# Patient Record
Sex: Female | Born: 1955 | Race: White | Hispanic: No | Marital: Married | Smoking: Former smoker
Health system: Southern US, Community
[De-identification: ages and names within clinical notes are randomized; demographics above are authoritative.]

## PROBLEM LIST (undated history)

## (undated) DIAGNOSIS — E119 Type 2 diabetes mellitus without complications: Secondary | ICD-10-CM

## (undated) DIAGNOSIS — R112 Nausea with vomiting, unspecified: Secondary | ICD-10-CM

## (undated) DIAGNOSIS — M1990 Unspecified osteoarthritis, unspecified site: Secondary | ICD-10-CM

## (undated) DIAGNOSIS — I2699 Other pulmonary embolism without acute cor pulmonale: Secondary | ICD-10-CM

## (undated) DIAGNOSIS — Z9889 Other specified postprocedural states: Secondary | ICD-10-CM

## (undated) HISTORY — PX: ABDOMINAL HYSTERECTOMY: SHX81

---

## 1998-12-09 HISTORY — PX: CARPAL TUNNEL RELEASE: SHX101

## 2001-07-30 ENCOUNTER — Ambulatory Visit (HOSPITAL_COMMUNITY): Admission: RE | Admit: 2001-07-30 | Discharge: 2001-07-30 | Payer: Self-pay | Admitting: Family Medicine

## 2001-07-30 ENCOUNTER — Encounter: Payer: Self-pay | Admitting: Family Medicine

## 2001-11-23 ENCOUNTER — Encounter: Payer: Self-pay | Admitting: Emergency Medicine

## 2001-11-23 ENCOUNTER — Inpatient Hospital Stay (HOSPITAL_COMMUNITY): Admission: EM | Admit: 2001-11-23 | Discharge: 2001-11-25 | Payer: Self-pay

## 2001-12-21 ENCOUNTER — Encounter: Payer: Self-pay | Admitting: Family Medicine

## 2001-12-21 ENCOUNTER — Ambulatory Visit (HOSPITAL_COMMUNITY): Admission: RE | Admit: 2001-12-21 | Discharge: 2001-12-21 | Payer: Self-pay | Admitting: Family Medicine

## 2003-12-10 DIAGNOSIS — I2699 Other pulmonary embolism without acute cor pulmonale: Secondary | ICD-10-CM

## 2003-12-10 HISTORY — DX: Other pulmonary embolism without acute cor pulmonale: I26.99

## 2004-12-06 ENCOUNTER — Inpatient Hospital Stay (HOSPITAL_COMMUNITY): Admission: EM | Admit: 2004-12-06 | Discharge: 2004-12-08 | Payer: Self-pay | Admitting: Internal Medicine

## 2004-12-19 ENCOUNTER — Ambulatory Visit (HOSPITAL_COMMUNITY): Admission: RE | Admit: 2004-12-19 | Discharge: 2004-12-19 | Payer: Self-pay | Admitting: Family Medicine

## 2004-12-19 ENCOUNTER — Inpatient Hospital Stay (HOSPITAL_COMMUNITY): Admission: AD | Admit: 2004-12-19 | Discharge: 2004-12-22 | Payer: Self-pay | Admitting: Family Medicine

## 2004-12-28 ENCOUNTER — Ambulatory Visit (HOSPITAL_COMMUNITY): Admission: RE | Admit: 2004-12-28 | Discharge: 2004-12-28 | Payer: Self-pay | Admitting: Family Medicine

## 2005-01-16 ENCOUNTER — Inpatient Hospital Stay (HOSPITAL_COMMUNITY): Admission: AD | Admit: 2005-01-16 | Discharge: 2005-01-21 | Payer: Self-pay | Admitting: Pediatrics

## 2005-07-15 ENCOUNTER — Ambulatory Visit (HOSPITAL_COMMUNITY): Admission: RE | Admit: 2005-07-15 | Discharge: 2005-07-15 | Payer: Self-pay | Admitting: Family Medicine

## 2005-09-03 IMAGING — CT CT ANGIO CHEST
2 of 4 series · 19 of 36 positions shown · IV contrast (CONTRAST)
Comparison: none

CLINICAL DATA: Chest pain and dyspnea.
 CHEST CT ANGIO WITH CONTRAST ? 12/19/04:
TECHNIQUE: Multidetector CT imaging of the chest was performed according to the protocol for detection of pulmonary embolism during IV bolus injection of 150 ml Omnipaque 300.  Coronal and sagittal plane reformatted images were also generated.

[Series 6460: — · axial · 0.66mm/px · z∈[+1549,+1803]mm · 16 of 468 slices shown (1 of 2)]
[im 22/468  lung]
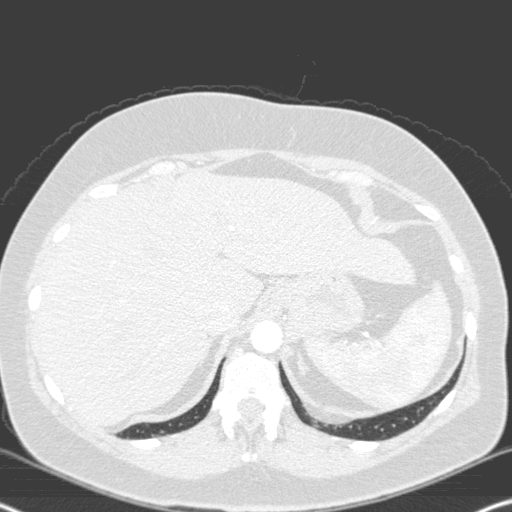
[im 43/468  mediastinal]
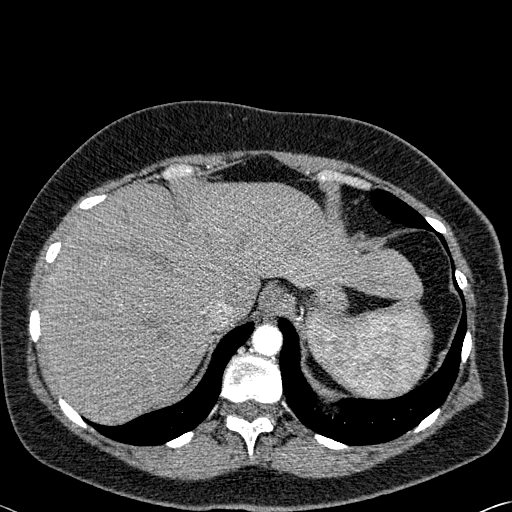
[im 85/468  lung]
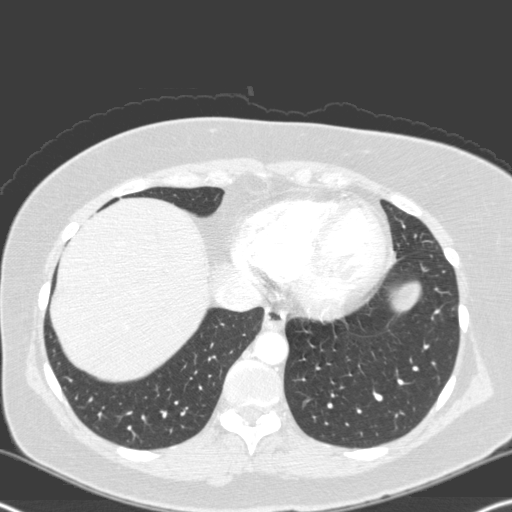
[im 107/468  mediastinal]
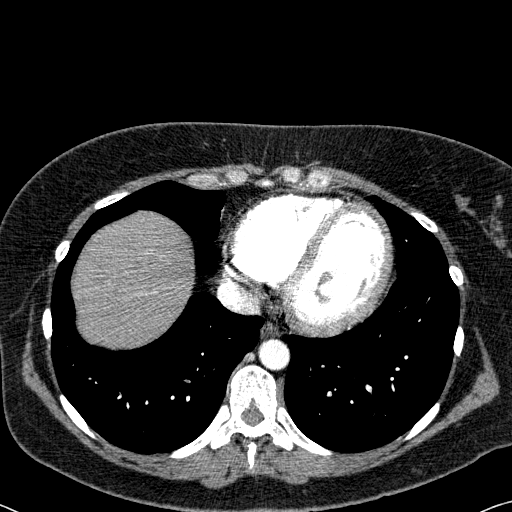
[im 128/468  lung]
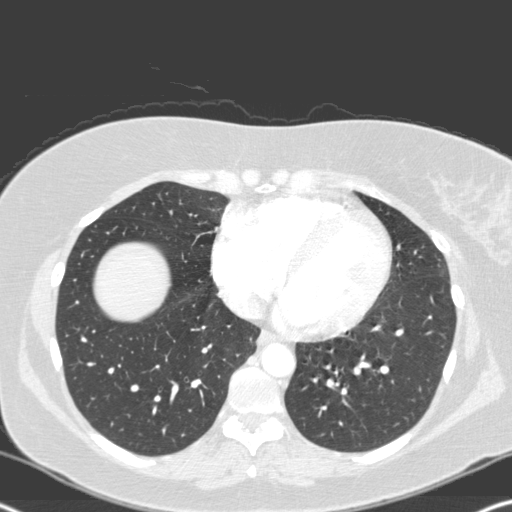
[im 170/468  mediastinal]
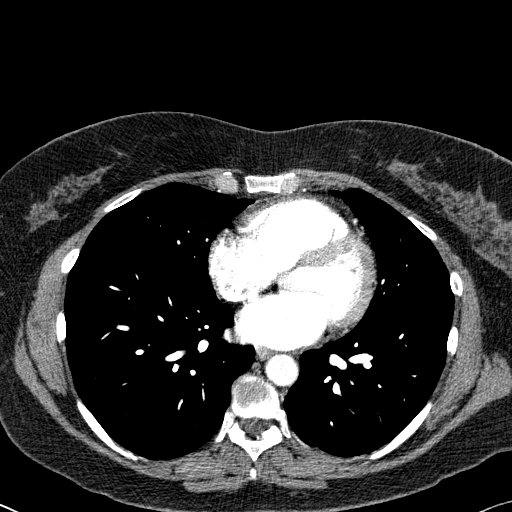
[im 192/468  lung]
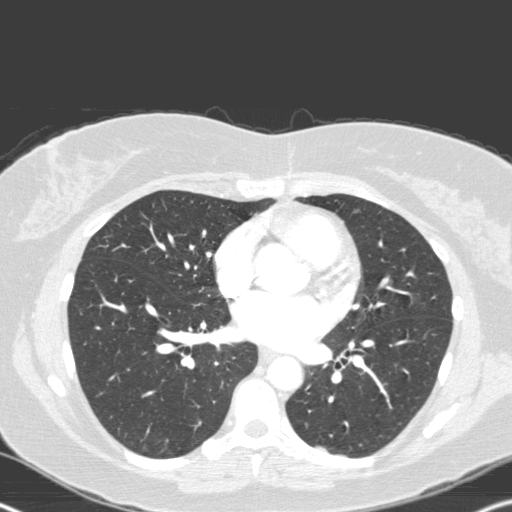
[im 213/468  mediastinal]
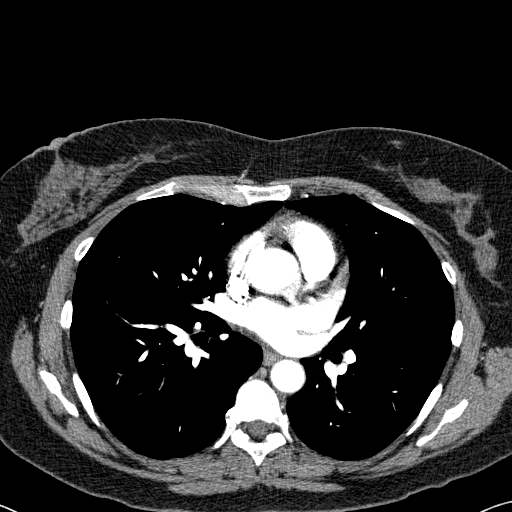
[im 255/468  lung]
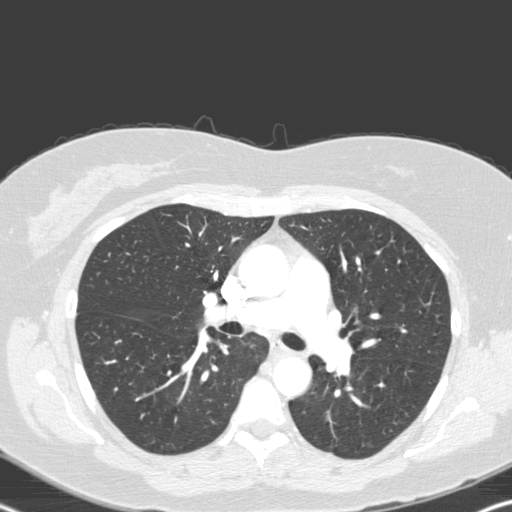
[im 276/468  mediastinal]
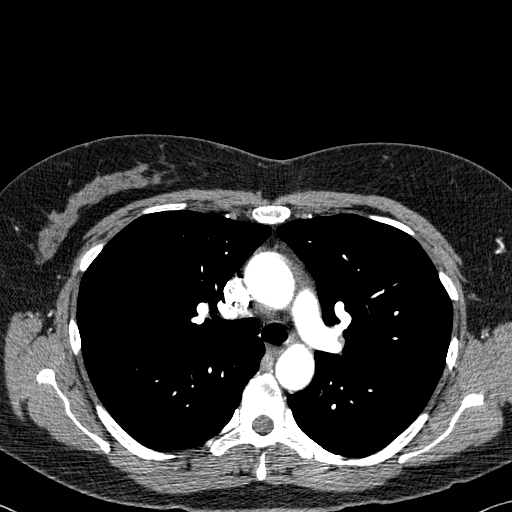
[im 298/468  lung]
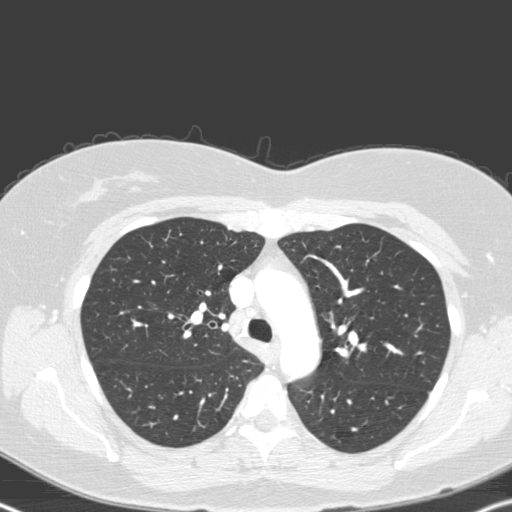
[im 340/468  mediastinal]
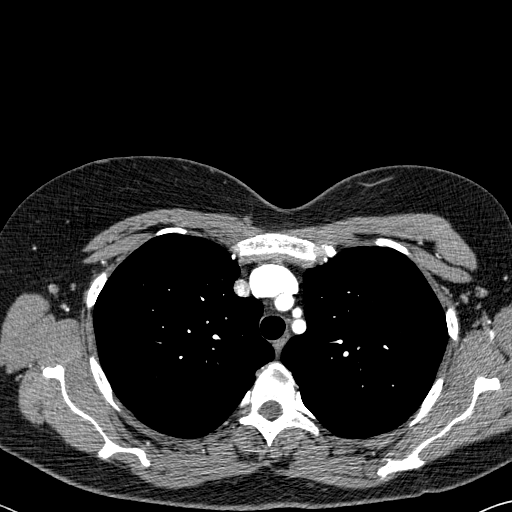
[im 361/468  lung]
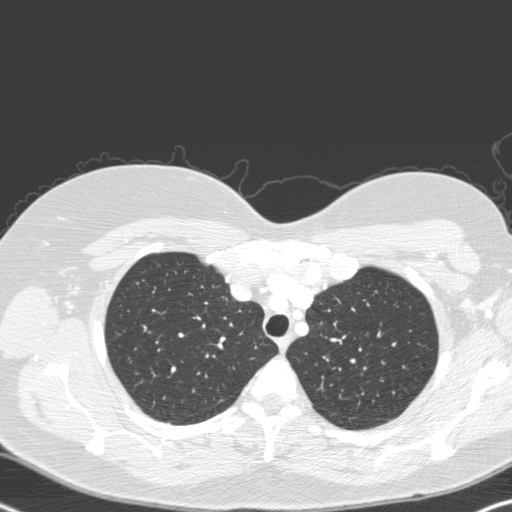
[im 383/468  mediastinal]
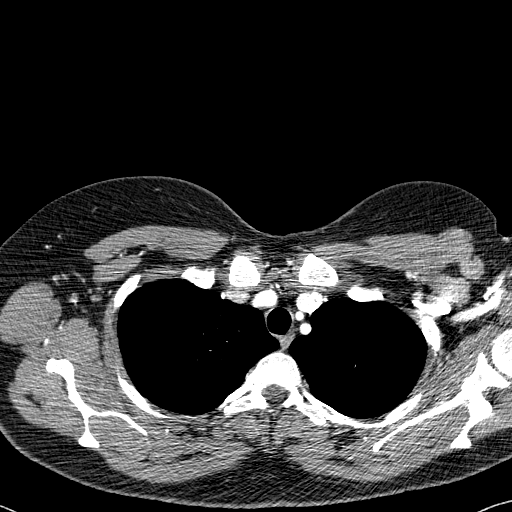
[im 425/468  lung]
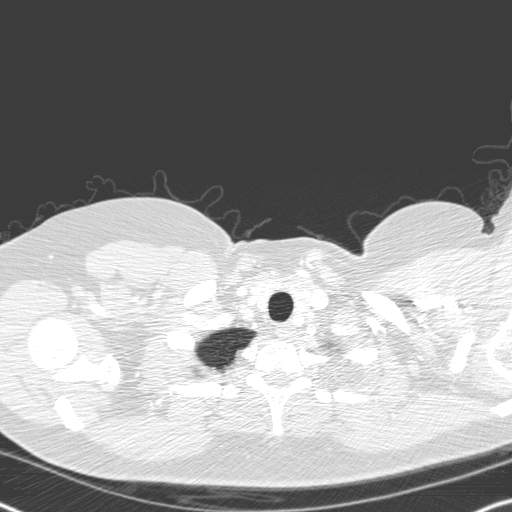
[im 446/468  mediastinal]
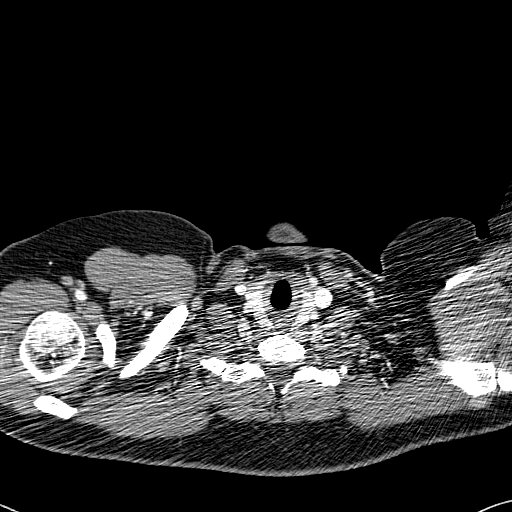

[— · coronal · 0.56mm/px · 3 of 60 slices shown (2 of 2)]
[im 12/60  mediastinal]
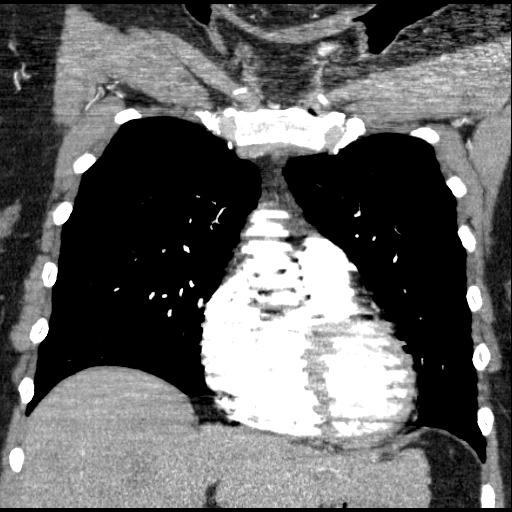
[im 24/60  mediastinal]
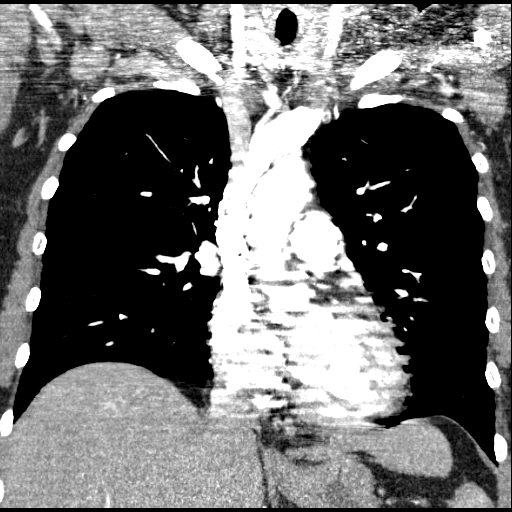
[im 36/60  mediastinal]
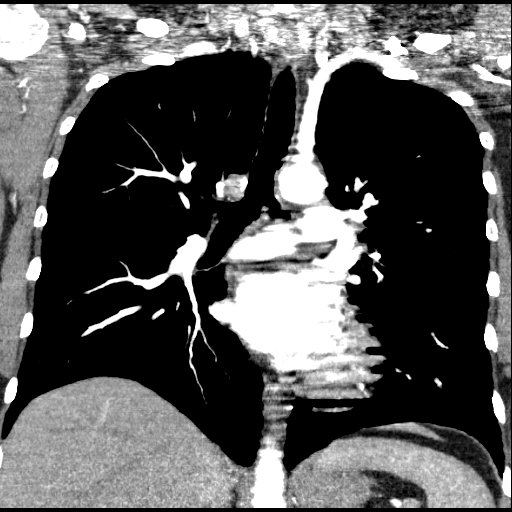

[19 of 36 positions shown; findings below may reference images not displayed]

FINDINGS: There is a single filling defect in a subsegmental branch of the posterior right lower lobe pulmonary artery (images #[id number]).  No other pulmonary emboli are identified.  The remainder of the heart and great vessels are unremarkable.  No pleural or pericardial effusions are identified.  The lungs are otherwise clear.  A tiny posterior left diaphragmatic hernia is noted with a small amount of herniated fat.
IMPRESSION: Single subsegmental pulmonary embolus in the right lower lobe.  Correlate clinically.

## 2009-09-06 ENCOUNTER — Ambulatory Visit (HOSPITAL_COMMUNITY): Admission: RE | Admit: 2009-09-06 | Discharge: 2009-09-06 | Payer: Self-pay | Admitting: Family Medicine

## 2009-09-14 ENCOUNTER — Ambulatory Visit (HOSPITAL_COMMUNITY): Admission: RE | Admit: 2009-09-14 | Discharge: 2009-09-14 | Payer: Self-pay | Admitting: Chiropractic Medicine

## 2009-09-21 ENCOUNTER — Ambulatory Visit (HOSPITAL_COMMUNITY): Admission: RE | Admit: 2009-09-21 | Discharge: 2009-09-21 | Payer: Self-pay | Admitting: Family Medicine

## 2009-09-21 ENCOUNTER — Encounter (INDEPENDENT_AMBULATORY_CARE_PROVIDER_SITE_OTHER): Payer: Self-pay | Admitting: Diagnostic Radiology

## 2010-03-28 ENCOUNTER — Ambulatory Visit (HOSPITAL_COMMUNITY): Admission: RE | Admit: 2010-03-28 | Discharge: 2010-03-28 | Payer: Self-pay | Admitting: Family Medicine

## 2010-06-06 IMAGING — US US BIOPSY
1 series · 6 of 6 positions shown · non-contrast
Comparison: none

Addendum Begins

I discussed the pathology results with the patient by telephone on
09/22/2009.  Pathology demonstrates stromal fibrosis which, per
discussion with Dr. Ammaro, is felt to be concordant with the imaging
appearance.  6-month follow-up left diagnostic mammogram is
recommended for this and the more posterior finding in the left
breast.  The patient reports no problems at the biopsy site.  All
questions were answered.
Addendum Ends
CLINICAL DATA: Suspicious left breast mass [DATE] location

[Series 1: us biopsy · 0.07mm/px · 6 of 6 slices shown]
[im 1/6]
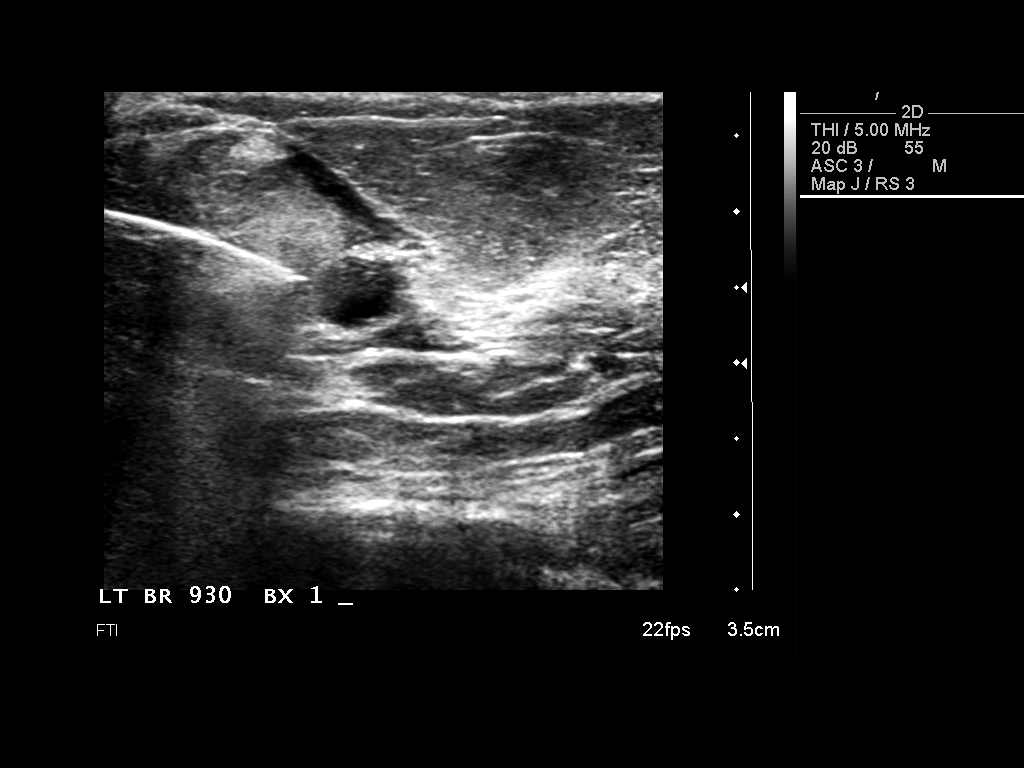
[im 2/6]
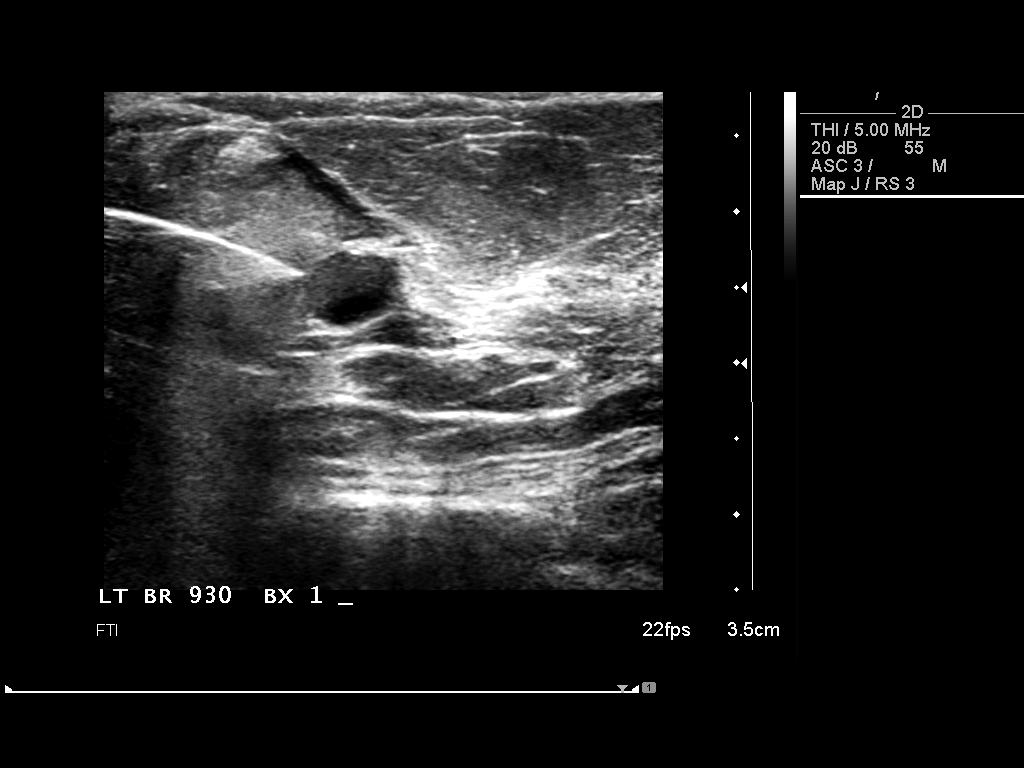
[im 3/6]
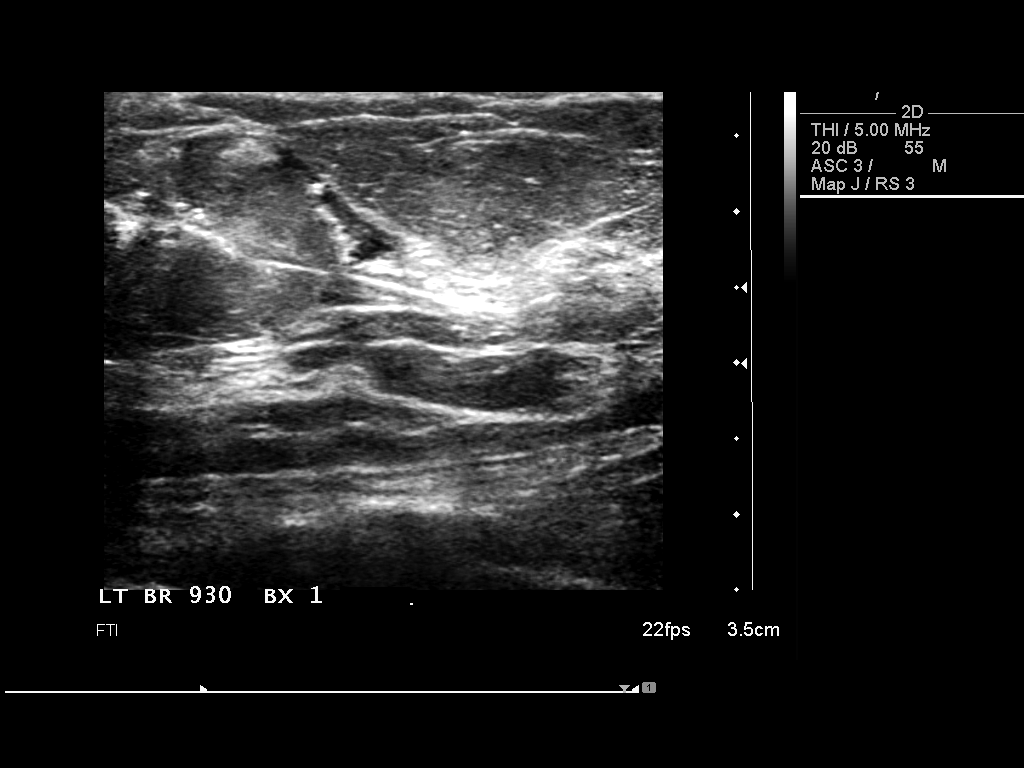
[im 4/6]
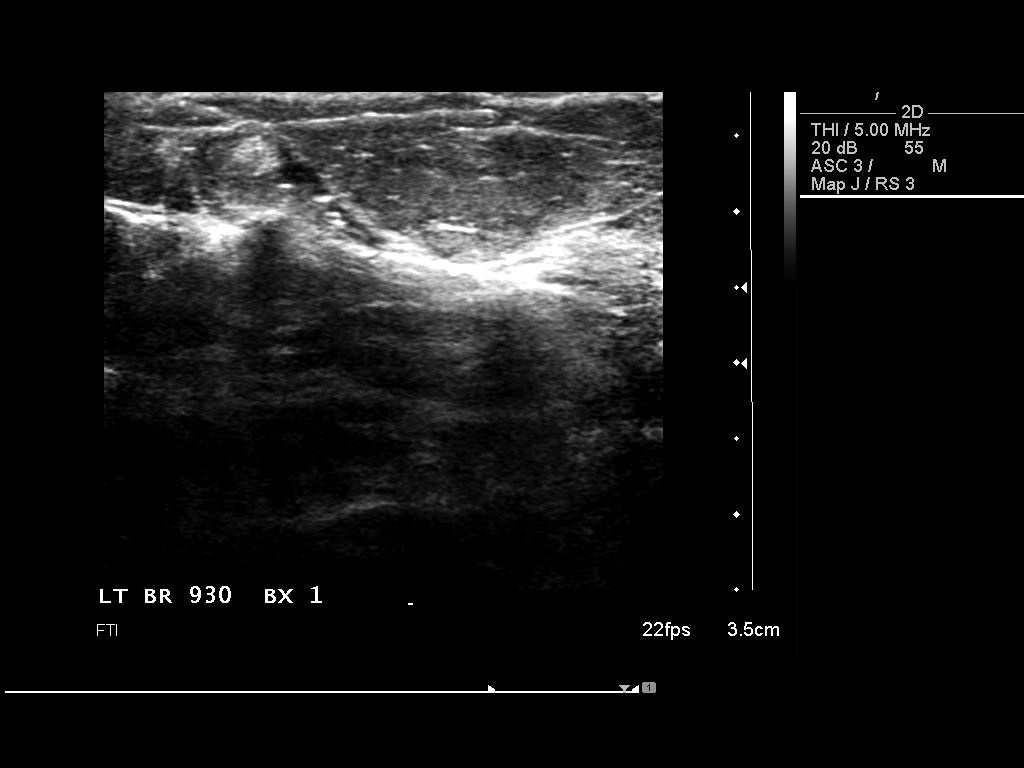
[im 5/6]
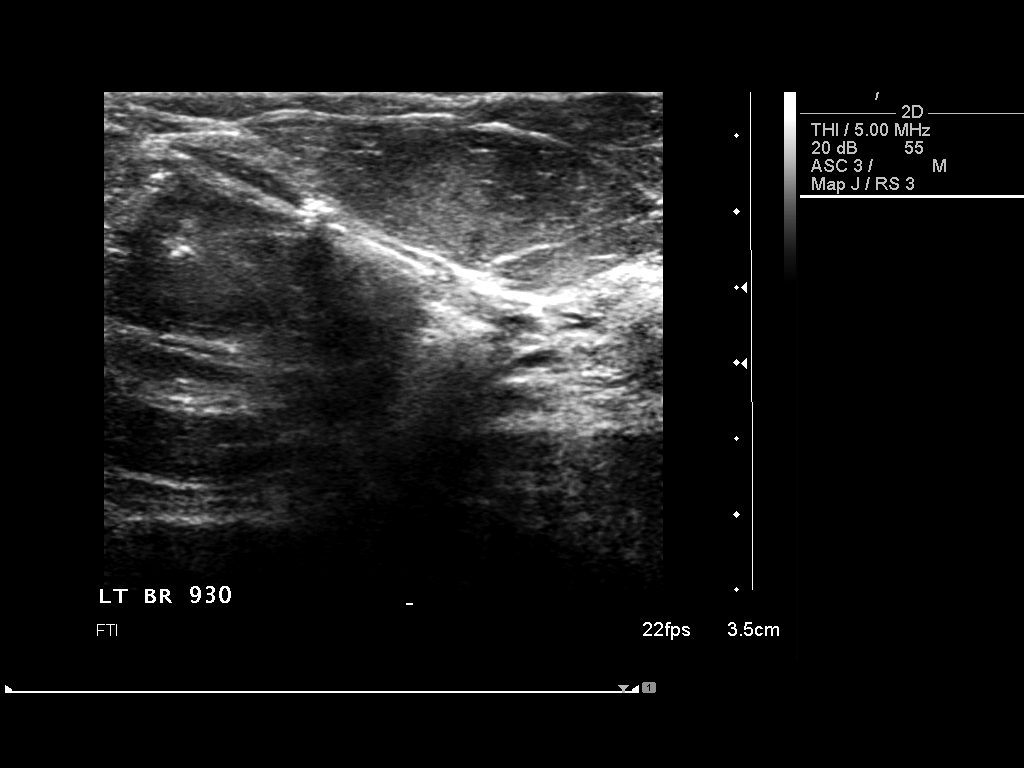
[im 6/6]
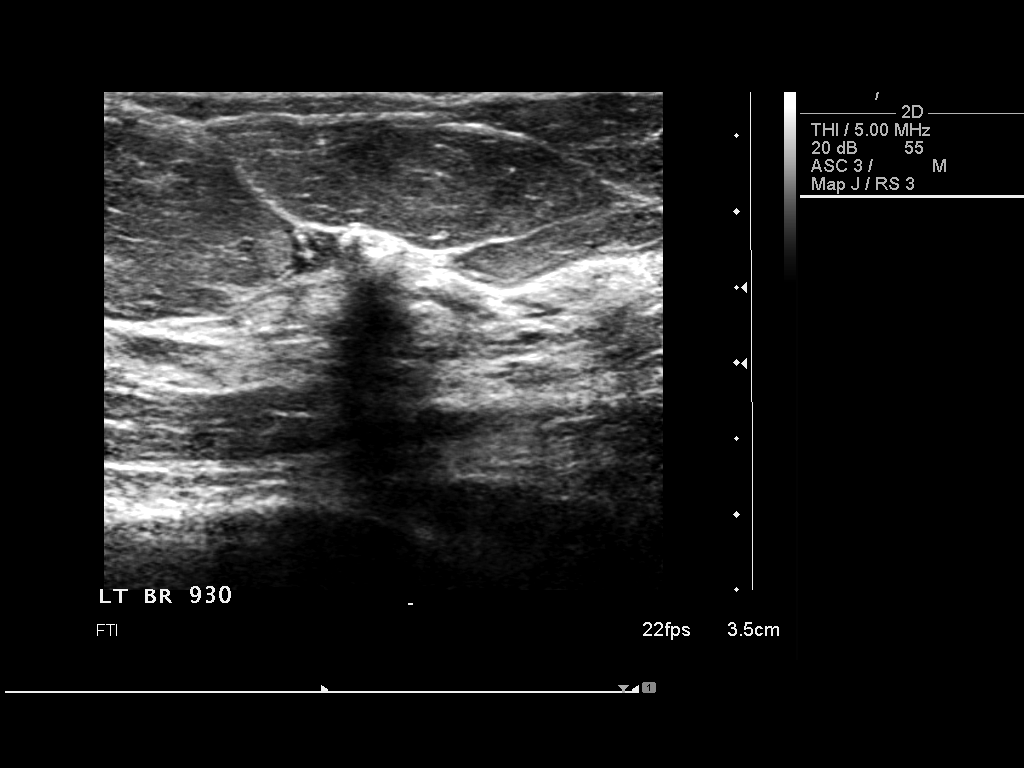

[6 of 6 positions shown; findings below may reference images not displayed]

ULTRASOUND GUIDED VACUUM ASSISTED CORE BIOPSY OF THE LEFT BREAST

I met with the patient, and we discussed the procedure of
ultrasound-guided biopsy, including risks, benefits, and
alternatives.  Specifically, we discussed the risks of infection,
bleeding, tissue injury, clip migration, and inadequate sampling.
Informed, written consent was given.

Using sterile technique, 2% lidocaine, ultrasound guidance, and a
12 gauge vacuum assisted needle, biopsy was performed of the left
breast mass [DATE] location, using a medial to lateral approach.
After 1 pass, the mass was no longer visible.  At the conclusion of
the procedure, a tissue marker clip was deployed into the biopsy
cavity.  Follow-up 2-view mammogram was performed and dictated
separately.
IMPRESSION: Ultrasound-guided biopsy of left breast mass [DATE] location, with
clip placement.  No apparent complications.

## 2010-10-24 ENCOUNTER — Ambulatory Visit (HOSPITAL_COMMUNITY): Admission: RE | Admit: 2010-10-24 | Discharge: 2010-10-24 | Payer: Self-pay | Admitting: Family Medicine

## 2011-04-26 NOTE — Discharge Summary (Signed)
[facility]  Patient:    [name], WOJNAR Visit Number: [id number] MRN: [id number]          Service Type: MED Location: 3A A308 01 Attending Physician:  John Giovanni Dictated by:   John Giovanni, [name] Proc. Date: 11/25/01 Admit Date:  11/22/2001 Discharge Date: 11/25/2001                             Discharge Summary  BRIEF HISTORY:  This 55 year old woman was admitted to the hospital with presumptive viral gastroenteritis.  She had a benign 3 day hospital course extending from December 16, to November 25, 2001.  Vital signs remained stable.  Her admission white cell count was 15,200, with 89 neutrophils, 3 lymphs. Met-7 was normal except for a glucose of 151.  Her repeat white cell count on the day of discharge was 8,000.  She had at this time 73% neutrophils and 18% lymphocytes.  Repeat Met-7 showed potassium 3.0, glucose 114, BUN 2 with a creatinine of 0.6.  Her abdominal and chest x-rays were essentially normal.  After being in the hospital for several days she noted that she had been having dysuria for 2 days before hospitalization.  Treatment included IV normal saline 125 cc/hr with a clear liquid diet and IV Phenergan and Demerol.  She was given Rocephin 1 gram IV q.24h with p.r.n. Tylenol and Ambien.  By her second day IV was able to be decreased to 75 mL/hr.  She was put back on lorcet 7.5 q.i.d. which she had been on chronically for fibromyalgia and rheumatoid arthritis.  She was tolerating liquids well at that point and by the day of discharge was tolerating solids well.  Still with diarrhea but the nausea and vomiting having cleared.  DISCHARGE DIAGNOSES: 1. Probable viral gastroenteritis. 2. Possible UTI. 3. Hyperglycemia both on a random specimen (151), and a fasting specimen    (114). 4. Mild dehydration. 5. Rheumatoid arthritis.  PLAN: 1. Discharge her home on Ceftin 500 mg b.i.d. x 7 days (#14 no refills). 2.  Continue with methotrexate 2.5 mg 4 q. week. 3. Lorcet 7.5 q.i.d.  FOLLOW UP:  Follow up in the office as scheduled December 29, 2000. Dictated by:   John Giovanni, [name] Attending Physician:  John Giovanni DD:  11/25/01 TD:  11/25/01 Job: [id number]

## 2011-04-26 NOTE — Group Therapy Note (Signed)
[facility]  Patient:    [name], [name] Visit Number: [id number] MRN: [id number]          Service Type: MED Location: 3A A308 01 Attending Physician:  Herbert Seta Dictated by:   John Giovanni, [name] Admit Date:  11/22/2001                               Progress Note  SUBJECTIVE:  The patient is feeling much better.  She is having diarrhea today but the nausea and vomiting have cleared.  She notes in the last month she has had intermittent fevers.  She was treated with antibiotics recently and within a few days, started with fevers again and also has noted she has been having burning with urination for at least a week. This burning has now cleared in the hospital.  OBJECTIVE:  Temperature is 98.8, pulse 77, the respiratory rate is 20, blood pressure 112/67.  She is oriented and alert, in no acute distress, well-developed, well-nourished.  Color is good tonight.  Looks better hydrated.  The lungs are clear throughout.  The heart had a regular rhythm, rate of about 70.  The abdomen is soft without hepatosplenomegaly or mass. Skin turgor is normal.  Mucous membranes are moist.  ASSESSMENT: 1. Probably viral gastroenteritis. 2. Cannot rule out bacterial cystitis or even a pyelonephritis.  PLAN:  Continue the IV Rocephin.  Decrease IV to 75 cc/hr from 125 cc/hr. Start back on Lorcet 7.5 mg q.i.d. and use Demerol just for cover for severe pain.  Plan to discharge home tomorrow on p.o. antibiotics.  Recheck a CBC and a MET-7 in the morning, also A1c, given her admission glucose of 151. Dictated by:   John Giovanni, [name] Attending Physician:  Herbert Seta DD:  11/24/01 TD:  11/25/01 Job: [id number]

## 2011-04-26 NOTE — Discharge Summary (Signed)
NAMEPAIZLIE, Diana                ACCOUNT NO.:  [id number]   MEDICAL RECORD NO.:  [id number]          PATIENT TYPE:  INP   LOCATION:  A338                          FACILITY:  [facility]   PHYSICIAN:  Mila Homer. Sudie Bailey, M.D.DATE OF BIRTH:  1956-10-20   DATE OF ADMISSION:  12/19/2004  DATE OF DISCHARGE:  01/14/2006LH                                 DISCHARGE SUMMARY   HOSPITAL COURSE:  This 55 year old had a four day hospitalization at [facility] staying from December 19, 2004-December 22, 2004.  She was found  to have pulmonary emboli.  Vital signs were stable.   On admission blood gas on room air showed a pH of 7.43, PCO2 of 36, PO2 of  97, bicarbonate 24.  A CBC was normal.  Admission INR was 1.0 and 1.2 on her  second day, 2.1 on her third day, and 2.5 on her fourth day.  Her  sedimentation rate was 9.  B-MET was normal except for a glucose that was  mildly elevated at 115.  Her hepatic function was normal.   She had an admission chest x-ray, which showed no acute cardiopulmonary  disease.  Her CT angiogram with contrast of the chest showed a single  filling defect in the subsegmental branch of the posterior right lower lobe  pulmonary artery.  Her bilateral venous Dopplers were normal with no signs  of deep venous thrombophlebitis.   She was admitted to the hospital and started on Lovenox 1 mg/k  subcutaneously q.12h., O2 at 2 L/minute, Coumadin 10 mg immediately and 10  mg on the night of admission, with Lorcet Plus q.i.d. for pain or Tylox  __________ mg daily, Lexapro 20 mg daily, Lunesta 3 mg q.h.s. for sleep,  folic acid 1 mg daily, and KCl 20 mEq twice a day.  She was initially at bed  rest and given a regular diet.   She had an EKG, which was felt to be unremarkable.   She seemed to be gradually improving during the hospitalization but still  remained fairly short of breath.  She was stable and ready for discharge  home her fourth day.   DISCHARGE MEDICATIONS:  1.  Lorcet Plus q.i.d. (120 with 2 refills).  2.  Prevacid 30 mg daily (#30 with 11 refills).  3.  Coumadin 5 mg alternating 10 mg one day and 5 another, starting with 2      tablets of 5 mg Coumadin the night of discharge Saturday, December 22, 2004 (#45 with 2 refills).  4.  She is to continue KCl 20 mEq 1 daily.  5.  Lexapro 20 mg daily.   FOLLOW UP:  In my office in three days for a PT-INR and also she is to bring  her old medicines at that time.   FINAL DISCHARGE DIAGNOSES:  1.  Pulmonary embolus.  2.  Dyspnea, which may be secondary to pulmonary emboli or other etiologies      such as coronary artery disease, fibrosis, etc.  3.  Depression.  4.  Fibromyalgia.  5.  Chronic narcotic  use due to fibromyalgia.  6.  Hypokalemia.   DISCHARGE INSTRUCTIONS:  After discharge arrangements will be made for her  to have pulmonary function tests within about two weeks of discharge and  also she is to see Dr. Domingo Sep, cardiologist, with the thought that if she  is still having any chest heaviness with exertion that she will need a  cardiac catheterization to supplement her fairly extensive cardiac workup  she had done several years ago.   ADDENDUM:  Time spent today reviewing her paper medical record, electronic  medical record, talking to her, writing out prescriptions, formulating a  plan, and dictating a note was 45 minutes.      SDK/MEDQ  D:  12/22/2004  T:  12/22/2004  Job:  [id number]

## 2011-04-26 NOTE — H&P (Signed)
[name], [name]                ACCOUNT NO.:  [id number]   MEDICAL RECORD NO.:  [id number]          PATIENT TYPE:  INP   LOCATION:  A222                          FACILITY:  [facility]   PHYSICIAN:  Mila Homer. Sudie Bailey, M.D.DATE OF BIRTH:  [birth year]   DATE OF ADMISSION:  12/19/2004  DATE OF DISCHARGE:  LH                                HISTORY & PHYSICAL   This is a 55 year old who presented to the office the day of admission,  December 19, 2004.  She had been recently hospitalized with a syncopal  episode that was felt to be viral gastroenteritis.  She had no further  syncopal episodes but just did not feel energetic.  However, she told me she  had been becoming markedly more short of breath over the last month.  Minimal activity would induce dyspnea.   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   She has had fibromyalgia with work-up here and also in [place].  Currently she is on methotrexate 2.5 mg 7 a week which seems to control the  symptoms somewhat.  She has been trying many other drugs for this in the  past including tricyclic antidepressants.  She also uses hydrocodone/APAP  7.5/650 four times daily  for pain and has been on an NSAID as well.  She  has had no chest pain or chest fluttering.  She has had no nausea, vomiting,  diarrhea at present.   She was seen in the office.  She seemed to be breathing well.  Color was  good at rest.  Her heart had a regular rhythm without murmur, rate of 70.  The lungs appeared clear throughout. There were no intercostal fractures.  There was no use of accessory muscles of respiration.  The abdomen was soft  without hepatosplenomegaly or mass or tenderness.  There was no axillary or  supraclavicular adenopathy.  Negative anterior cervical nodes.  The patient  herself is oriented and alert and although she is fatigued, with no acute  distress.  There is no edema of her  ankles.   Blood gases on admission showed pH 7.43, PCO2 36, PO2 of 97 on room air.  Her admission PT/INR was 13.2/1.0.  Her chest x-ray showed no acute  cardiopulmonary disease but the CT angio of the chest shows a single  subsegmental pulmonary embolus in the right lower lobe..   ADMISSION DIAGNOSES:  1.  Pulmonary embolus.  2.  Fibromyalgia.   PLAN:  Admit to hospital on Lovenox and start Coumadin immediately.  Other  blood work is pending such as a CBC, MET7, liver function tests.  Use oxygen  as needed.  Venous Dopplers of legs are pending.    SDK/MEDQ  D:  12/20/2004  T:  12/20/2004  Job:  [id number]

## 2011-04-26 NOTE — Procedures (Signed)
NAMEJERSEE, [name]                ACCOUNT NO.:  [id number]   MEDICAL RECORD NO.:  [id number]          PATIENT TYPE:  OUT   LOCATION:  RESP                          FACILITY:  [facility]   PHYSICIAN:  Edward [name], M.D.DATE OF BIRTH:  1956/09/16   DATE OF PROCEDURE:  DATE OF DISCHARGE:  12/28/2004                              PULMONARY FUNCTION TEST   RESULTS:  1.  Spirometry is normal.  2.  Lung volumes are normal.  3.  DLCO is normal.     Edwa   ELH/MEDQ  D:  12/30/2004  T:  12/30/2004  Job:  [id number]

## 2011-04-26 NOTE — Discharge Summary (Signed)
[name], [name]                ACCOUNT NO.:  [id number]   MEDICAL RECORD NO.:  [id number]          PATIENT TYPE:  INP   LOCATION:  A313                          FACILITY:  [facility]   PHYSICIAN:  Mila Homer. Sudie Bailey, M.D.DATE OF BIRTH:  1956-11-16   DATE OF ADMISSION:  12/05/2004  DATE OF DISCHARGE:  12/31/2005LH                                 DISCHARGE SUMMARY   HOSPITAL COURSE:  This 55 year old woman was admitted to the hospital with  nausea and vomiting and diarrhea.  She had a benign four day hospitalization  extending from December 05, 2004-December 31,2005.  Vital signs remained  stable.   Her admission white cell count was 11,200, with 91% neutrophils.  H&H  12.9/37.0.  MCV of 88.  Platelet count of 255,000.  CMP showed a potassium  of 3.3, a glucose of 141.  CK and CK-MB and troponin were all negative.  Hepatic function was normal.  On her third day a recheck of her white cell  count was 9200, H&H 11.9/34.3 after volume repletion.  She had 70%  neutrophils and 21% lymphocytes at this time.  MET-7 showed a potassium up  to 3.4, glucose 137.  On the day of discharge the white cell count was down  to 5800, of which 50% was neutrophils, 30% lymphs, H&H 11.9/34.7.  Her B-MET  showed a potassium of 4.0, glucose was still elevated at 119.   She was admitted to the hospital by Dr. Delbert Harness on call for me.   She was continued on Lexapro 50 mg daily, Lorcet Plus q.6h. p.r.n., folic  acid 1 mg daily, and Prevacid 30 mg daily with methotrexate 2.5 mg 7 tablets  q.Sunday.  Her second day KCl 20 mEq b.i.d. was started and her hydrocodone  was increased to a 5 mg tablet 1-1/2 tablet q.4h. for pain with Imodium for  diarrhea.   She was doing a lot better by her third day and by her fourth day she was  still weak but the vomiting had cleared and the diarrhea was much improved  and she was ready for discharge home at that time.  The IV was discontinued.   DISCHARGE MEDICATIONS:  1.  KCl  20 mEq daily quantity of 30 no refills.  2.  She was to take her methotrexate 2.5 mg 7 tablets the day after      discharge.  3.  Continue with Lexapro 10 mg daily for depression.  4.  Lorcet Plus for her pain from her fibromyalgia.  5.  Folic acid 1 mg daily.  6.  Prevacid 30 mg daily.   DISCHARGE INSTRUCTIONS:  She was going to get a flu shot before discharge  and a pneumonia vaccine when she was seen in the office the week after  discharge.  She is going to bring all of her medications to the office at  that time for our review.   FINAL DISCHARGE DIAGNOSES:  1.  Presumptive viral gastroenteritis.  2.  Syncopal episode secondary to dehydration.  3.  Dehydration from viral gastroenteritis.  4.  Hypokalemia.  5.  Fibromyalgia.  6.  Depression.  7.  Chronic pain and chronic narcotic use due to fibromyalgia.      SDK/MEDQ  D:  12/08/2004  T:  12/08/2004  Job:  [id number]

## 2011-04-26 NOTE — Discharge Summary (Signed)
[name], [name]                ACCOUNT NO.:  [id number]   MEDICAL RECORD NO.:  [id number]          PATIENT TYPE:  INP   LOCATION:  3743                         FACILITY:  [facility]   PHYSICIAN:  Thereasa Solo. Little, [name] DATE OF BIRTH:  09/21/1956   DATE OF ADMISSION:  01/16/2005  DATE OF DISCHARGE:  01/21/2005                                 DISCHARGE SUMMARY   REASON FOR ADMISSION:  [name] was admitted by Dr. Dani Gobble for  heparin to Coumadin cross over and to undergo cardiac catheterization.   HISTORY OF PRESENT ILLNESS:  She has been having trouble with shortness of  breath and nausea, thus she came in. She underwent cardiac catheterization  on January 18, 2005. She had normal LV function, normal coronaries, normal  right heart catheterization. She was then placed back on Lovenox to  Coumadin. She was kept in the hospital. The plan was to keep her into the  hospital until she was therapeutic. It appeared that her insurance company  would not pay for Lovenox. Then on January 22, 2005, we got word that she  could get Lovenox patient's assistance program. Thus, she was given Lovenox  80 mg b.i.d. x 5 doses. She was taught how to inject herself. Dr. Mila Homer.  Sudie Bailey was called and he was made aware that she would be on Lovenox to  Coumadin secondary to the fact that he follows her pro time and INR.   Also tonight we did get her PFT's that were done in [place]. She had  essentially normal pulmonary function tests. She had four doses of Coumadin.  She had 5 mg one day, 7.5 mg the second day, 10 mg the third day, and today  she will take another 10 mg. She will go to Dr. Mila Homer. Knowlton's office  in the morning for PT and INR.   LABORATORY DATA:  Her INR on January 21, 2005 was 1.5. Her hemoglobin was  12.5, hematocrit 36, wbc's 9.8, platelets 229,000. Sodium is 139, potassium  3.6, BUN 9, creatinine was 0.8, glucose 126. Her AST was 20, ALT 16, CK-MB  was negative x  3, troponin negative x 3. I do not see chest x-ray on the  chart at the time of this dictation.   MEDICATIONS:  1.  Lovenox 80 mg b.i.d. q.12h. She was given five doses.  2.  Coumadin for which she can resume her home dose of 5 mg every day. She      will take 10 mg tonight and then 5 mg every day.  3.  Prevacid 30 mg 1 q.d.  4.  Lexapro 20 mg 1/2 tablet q.d.  5.  Lunesta 3 mg before bed time.  6.  Methotrexate as taken previously.  7.  Folic acid 1 mg q.d.  8.  Coated aspirin 1 mg q.d. x 3 days.  9.  Potassium 20 mEq 1 q.d.   DISCHARGE DIAGNOSES:  1.  Shortness of breath with nausea, unknown etiology with normal complement      fixation tests, normal heart catheterization, and normal left  ventricular function.  2.  History of rheumatoid arthritis.  3.  History of pulmonary embolus.  4.  History of fibromyalgia.  5.  History of depression.  6.  History of hypokalemia.  7.  History of isolated episode of syncope thought to be due to dehydration,      viral gastritis.   DISPOSITION:  Dr. Gaspar Garbe B. Little discussed with her the PFT results prior  to her discharge.      BB/MEDQ  D:  01/21/2005  T:  01/21/2005  Job:  [id number]   cc:   Mila Homer. Sudie Bailey, [name]  [address] [place], [place] [address]  Fax: [phone]

## 2011-04-26 NOTE — Group Therapy Note (Signed)
NAMEJAYLIE, [name]                ACCOUNT NO.:  [id number]   MEDICAL RECORD NO.:  [id number]          PATIENT TYPE:  INP   LOCATION:  A313                          FACILITY:  [facility]   PHYSICIAN:  Mila Homer. Sudie Bailey, M.D.DATE OF BIRTH:  1956/04/02   DATE OF PROCEDURE:  12/06/2004  DATE OF DISCHARGE:                                   PROGRESS NOTE   SUBJECTIVE:  This 55 year old was admitted to the emergency room last night  with a syncopal episode which followed vomiting and dehydration.  She is on  IV fluids right now and feeling better, but is still weak.  The patient  noted that she started feeling sick about three days ago.  Prior to this,  her husband had been sick with what was felt to be a stomache virus.  She  had 24 hours of nausea, vomiting and diarrhea.  She developed similar  symptoms several days later.   OBJECTIVE:  Temperature 98.4 degrees, pulse 89, respirations 20, blood  pressure 108/63.  GENERAL:  She is supine in bed, in no acute distress, well-developed and  well-nourished, oriented and alert.  LUNGS:  Clear throughout.  HEART:  A regular rhythm, with a rate of about 70.  ABDOMEN:  Soft, without hepatosplenomegaly or mass or tenderness.  Bowel  sounds appear to be normal.  EXTREMITIES:  There is no edema of the ankles.   ASSESSMENT:  1.  Viral gastroenteritis.  2.  Dehydration.  3.  Mild hypokalemia.  4.  Fibromyalgia.   PLAN:  Give IV fluids.  Add KCl 20 mEq b.i.d.  Recheck CBC and MET-7 in the  morning.      SDK/MEDQ  D:  12/06/2004  T:  12/06/2004  Job:  [id number]

## 2011-04-26 NOTE — Cardiovascular Report (Signed)
[name], [name]                ACCOUNT NO.:  [id number]   MEDICAL RECORD NO.:  [id number]          PATIENT TYPE:  INP   LOCATION:  3743                         FACILITY:  [facility]   PHYSICIAN:  Cristy Hilts. Jacinto Halim, MD       DATE OF BIRTH:  [birth year]   DATE OF PROCEDURE:  01/18/2005  DATE OF DISCHARGE:                              CARDIAC CATHETERIZATION   REFERRING CARDIOLOGIST:  Dani Gobble, MD.   PROCEDURE PERFORMED:  1.  Right and left heart catheterization including hemodynamic monitoring of      the right heart pressure data.  2.  Left ventriculography.  3.  Selective right and left coronary arteriography.   INDICATION:  [name] is a 55 year old female with history in the recent  past has had a pulmonary embolism.  She has been on chronic Coumadin  therapy.  She has been complaining of chest pain and shortness of breath.  Given this, a cardiac catheterization is being done to evaluate for  suspected coronary artery disease as screening.   HEMODYNAMIC DATA:  1.  The left ventricular pressures were 100/3 with end-diastolic pressure of      3 mmHg.  2.  Aortic pressure 100/65 with a mean of 80 mmHg.  3.  There was no pressure gradient across the aortic valve.   RIGHT HEART CATHETERIZATION HEMODYNAMIC DATA:  RA pressures were 8/5/5 mmHg.  RV pressures:  23/4, end-diastolic pressure 6 mmHg.  PA pressure:  21/8 with  mean of 14 mmHg.  Pulmonary capillary wedge 9/8 with a mean of 7 mmHg.  The  aortic saturation was 92% and PA saturation was 74%.   Cardiac output 6.6, cardiac index 3.4 by Fick method.   ANGIOGRAPHIC DATA:  Left ventricle:  Left ventricular systolic function was  normal and ejection fraction was estimated at 65%.  There is no significant  mitral regurgitation.   Right coronary artery:  The right coronary artery is a dominant vessel,  large caliber vessel.  It gives origin to large PDA and PLA.  There was  proximal catheter-induced spasm which was spontaneously  relieved.  There is  no significant luminal obstruction.   Left main coronary artery:  Left main coronary artery is a large caliber  vessel. It is normal.   Circumflex coronary artery:  Circumflex coronary artery is a large caliber  vessel.  It is normal.  It gives origin to a small obtuse marginal one, and  several small obtuse marginals, and continues as a large obtuse marginal 3.  It is normal.   Left anterior descending artery:  Left anterior descending artery is a large  caliber vessel.  It gives origin to several small diagonals.  It wraps  around the apex.  It is mildly tortuous.  There is no luminal obstruction.   ABDOMINAL AORTOGRAM:  Abdominal aortogram revealed presence of two renal  arteries; one on either side.  They are widely patent.  The aortoiliac  bifurcation was widely patent.   ILIAC ARTERIOGRAMS:  Left and right iliac arteriogram revealed widely patent  left iliac artery and the right external  iliac artery had a focal ring-like  20-30% stenosis.   IMPRESSION:  1.  Normal left ventricular systolic function.  Ejection fraction 65%.  2.  Normal coronary arteries.  Right dominant circulation.  3.  Normal right heart catheterization pressures and preserved cardiac      output and cardiac index.  4.  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  Otherwise, normal iliac      arterial system and normal renal arteries.   RECOMMENDATIONS:  Based on the cardiac catheterization data, continued  medical therapy is advised with evaluation of noncardiac cause of chest  pain.  The patient will follow with Dr. Kem Boroughs.   TECHNIQUE OF PROCEDURE:  Under usual sterile precautions, using a 6 French  right femoral arterial access and a 7 French right femoral venous access,  left and right heart catheterization was performed respectively.  Initially,  right heart catheterization was performed by  advancing a balloon-tipped Swan-  Ganz catheter into the inferior vena cava and then into the right atrium,  right ventricle, then the pulmonary artery was cannulated and traversed with  the help of a 0.025-inch support J wire.  The pulmonary capillary wedge was  easily obtained.  PA saturations were also obtained.  The catheter was  pulled out of the body in the usual fashion after monitoring the RV and RA  pressures.  The left heart catheterization was performed using a  multipurpose B2 catheter.  The catheter was gently advanced into the left  ventricle over a 0.035-inch J wire.  Left ventricular pressures were  monitored.  Hand contrast injection in the left ventricle was performed both  in LAO and RAO projection.  The catheter was flushed with saline and pulled  back into the ascending aorta and pressure gradient across the aortic valve  was monitored.  Right coronary artery was selectively engaged and  angiography was performed.  In a similar fashion, left main coronary was  selectively engaged and angiography was performed.  Then, the catheter was  pulled back in the abdominal aorta and abdominal aortogram was performed.  Right and left iliac arteriogram was performed through the same B2 catheter.  Then the catheter was pulled out of the body in the usual fashion.  Right  femoral angiography was performed through the arterial access sheath, and  the access was closed with Angio-Seal with excellent hemostasis obtained.  The patient was transferred out of the lab in stable condition.  The patient  tolerated the procedure well.      JRG/MEDQ  D:  01/18/2005  T:  01/18/2005  Job:  [id number]

## 2011-04-26 NOTE — H&P (Signed)
[facility]  Patient:    [name], [name] Visit Number: [id number] MRN: [id number]          Service Type: MED Location: 3A A308 01 Attending Physician:  Herbert Seta Dictated by:   John Giovanni, [name] Admit Date:  11/22/2001                           History and Physical  CHIEF COMPLAINT:  This 55 year old woman presented to the emergency room the day of admission having had severe vomiting most of the afternoon and evening.  HISTORY OF PRESENT ILLNESS:  She had to have a friend bring her from [place], [place].  She has recovered from a bronchitis last week, and stopped Levaquin about a week ago.  She did not quite feel herself three nights ago and then had some nausea two days ago and then vomiting yesterday.  Since being in the hospital she has now developed diarrhea as well.  She has a long history felt to be fibromyalgia but this summer symptoms became more like that of a seronegative rheumatoid arthritis.  She is having early morning stiffness which would last for hours and could not get her hands moving, had no energy, and actually stayed in the house days on end because she does not have enough energy to go outside.  At that time she was put on methotrexate 2.5 mg, three a week, which since then had to be advanced to four a week.  A lot of these symptoms had become much improved since then.  She has also been on Lorcet Plus, as many as five a day for pain, and also is taking Celexa for depression.  She has had close follow-up of her LFTs and CBC and these have been normal. Her husband, whom she sees on an intermittent basis since he works in [place], has not been sick.  PHYSICAL EXAMINATION:  GENERAL:  Pleasant 55 year old woman, who arrived in the ER by PMV, actually walked into the emergency department.  She is oriented, alert, in distress due to prolonged vomiting.  She is well-developed, well-nourished.  VITAL SIGNS:   Temperature 97 degrees, pulse 80, respiratory rate 16, blood pressure 114/55.  NECK:  No axillary, supraclavicular adenopathy.  HEART:  Regular rate and rhythm without murmur.  LUNGS:  Clear throughout.  ABDOMEN:  Soft, without hepatosplenomegaly or mass.  No tenderness on examination.  EXTREMITIES:  Normal.  LABORATORY DATA:  CBC, LFTs, and MET 7 normal except for WBC 15,000 with shift to the left.  Lymphocyte count was only 3% (normal 12-46).  ASSESSMENT:  1.  Viral gastroenteritis.  2.  Seronegative rheumatoid arthritis.  3.  Fibromyalgia.  PLAN OF TREATMENT:  IV fluids, with IV Demerol and Phenergan.  Hopefully we will get her on foods tomorrow and discharge her home. Dictated by:   John Giovanni, [name] Attending Physician:  Herbert Seta DD:  11/23/01 TD:  11/23/01 Job: [id number]

## 2011-04-26 NOTE — Group Therapy Note (Signed)
[name], [name]                ACCOUNT NO.:  [id number]   MEDICAL RECORD NO.:  [id number]          PATIENT TYPE:  INP   LOCATION:  A313                          FACILITY:  [facility]   PHYSICIAN:  Mila Homer. Sudie Bailey, M.D.DATE OF BIRTH:  Dec 04, 1956   DATE OF PROCEDURE:  DATE OF DISCHARGE:                                   PROGRESS NOTE   SUBJECTIVE:  She feels minimally better.  Vomiting has stopped, and she has  had no more syncopal episodes, but she has had fairly bad diarrhea.  She is  weak.  The cramping she was having in her distal legs and feet has cleared.   OBJECTIVE:  She is sitting up in bed.  She is in no acute distress.  Well-  developed, well-nourished, oriented and alert.  She is eating breakfast now.  Lungs are clear throughout.  Heart is a regular rhythm, rate of 70.  The abdomen is soft without hepatosplenomegaly or mass.  Normal bowel  sounds.   Today the white cell count is 9200, the H&H 11.9 and 34.3 .  The SMAC-7  shows a potassium of 3.4, glucose 137.   ASSESSMENT:  1.  Viral gastroenteritis.  2.  Hypokalemia.  3.  Hyperglycemia.   PLAN:  Continue IV fluids but decrease the D5-1/2 normal saline IV to 75 mL  an hour.  Sugar in the IV may have run her own sugar up.  Continue with the  potassium which is 4 mEq per liter IV and 20 mEq p.o. b.i.d.  Up in chair  and plan for discharge tomorrow as long as she is otherwise doing okay at  that point.      SDK/MEDQ  D:  12/07/2004  T:  12/07/2004  Job:  [id number]

## 2011-04-26 NOTE — Group Therapy Note (Signed)
NAMESUSIE, EHRESMAN                ACCOUNT NO.:  [id number]   MEDICAL RECORD NO.:  [id number]          PATIENT TYPE:  INP   LOCATION:  A222                          FACILITY:  [facility]   PHYSICIAN:  Mila Homer. Sudie Bailey, M.D.DATE OF BIRTH:  24-Aug-1956   DATE OF PROCEDURE:  DATE OF DISCHARGE:                                   PROGRESS NOTE   SUBJECTIVE:  She is still having some pain in her joints.  She is not  complaining of SOB.  She tells me now that she has been having dyspnea on  exertion for almost a year.  She ascribed it to being overweight due to a  lack of exercise due to ankle and knee pain, but then it persisted and we  worked it up as above.  She did have a cardiac workup with Dr. Ermalinda Memos  about two years ago and at that time the workup was negative.  The patient  of note if she is active, she will have a discomfort in the left chest, not  actually a discomfort, almost pain in the left lateral chest, left arm.  This has been going on for several years, but probably gradually worse.   OBJECTIVE:  The temperature is 98.9, pulse 63, respiratory rate 20, blood  pressure 105/69.  She is oriented, alert, in no acute distress, well  developed, somewhat overweight.  Lungs clear throughout.  The heart has a  regular rhythm, rate of about 70.  The abdomen is soft without tenderness,  but mildly obese without organomegaly or mass, and there is no edema of the  ankles.   CBC and med 7 were normal except for glucose of 115.  The PT-INR was 19.8  and 2.1.  O2 saturation today on 2 liters is 98% and her venous Dopplers  were negative for clot.   ASSESSMENT:  1.  Pulmonary emboli.  2. Fibromyalgia.  3. Question coronary artery      disease.   PLAN:  We will give another day of Lovenox.  Decrease the Coumadin.  Plan to  discharge tomorrow.  I discussed her case at length with Dr. Ginny Forth,  cardiology.  Plans are once we have made sure she is adequately  anticoagulated is for her to  follow up with Dr. Ermalinda Memos in three weeks and  get a cardiac catheterization.  EKG today.      SDK/MEDQ  D:  12/21/2004  T:  12/21/2004  Job:  [id number]

## 2011-04-26 NOTE — Consult Note (Signed)
[name], [name]                ACCOUNT NO.:  [id number]   MEDICAL RECORD NO.:  [id number]          PATIENT TYPE:  INP   LOCATION:  A313                          FACILITY:  [facility]   PHYSICIAN:  Melvyn Novas, MDDATE OF BIRTH:  [birth year]   DATE OF CONSULTATION:  DATE OF DISCHARGE:                                   CONSULTATION   The patient is a 55 year old white female with a history of fibromyalgia who  had been having persistent nausea for two to three days prior to admission.  She had some diffuse arthralgias and myalgias over the same two to three day  period and diarrhea for the same period. She denied any vomiting at that  time; any hematochezia or melena. She apparently had an acute episode of  severe lower back pain bilaterally, felt light-headed, got down on all fours  in the bathroom, and apparently she woke up next to the commode. There was  no physical trauma. She apparently had a syncopal episode presumably  mediated by volume depletion and from preceding diarrhea. She was seen in  the ER and then had one episode of vomiting in the ER without hematemesis.  She denied any antecedent anginal chest pain, palpitations, or presyncopal  episodes. She was seen in the ER with hyperkalemia and clinically volume  depleted. No evidence of hematuria to go along with nephrolithiasis as the  cause of her back pain and she is subsequently admitted for fluids, volumes,  antiemetic control, and potassium supplementation. There was no right upper  quadrant tenderness to go along with cholecystitis upon admission.   PAST MEDICAL HISTORY:  1.  Fibromyalgia.  2.  History of anxiety or depression.   PAST SURGICAL HISTORY:  Vaginal hysterectomy in 1995; otherwise  unremarkable.   ALLERGIES:  No known allergies.   SOCIAL HISTORY:  She is married. Does not smoke. Her cholesterol is normal.  She imbibes alcohol occasionally. 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   CURRENT MEDICATIONS:  1.  Methotrexate 25 mg seven pills once a week.  2.  Lexapro 10 mg a day.  3.  Prevacid 30  mg a day.  4.  Folic acid 1 mg per day.  5.  B12 supplement p.o.  6.  Coenzyme q.10h. directed by her rheumatologist.   PHYSICAL EXAMINATION:  VITAL SIGNS: Blood pressure is 113/82, pulse 87 and  regular. She is afebrile with respiratory rate of 18 to 20.  HEENT: Head is normocephalic and atraumatic. Eyes reveal PERRLA. EOMs  intact. Sclerae clear. Conjunctiva pink. Tongue shows dry mucosa. No  erythema. No exudates of the mouth.  NECK: No jugular venous distention. No carotid bruits. No thyromegaly.  LUNGS: Clear to A&P. No rales, rhonchi, or wheezes. There is no paraspinal  tenderness.  HEART: Regular rate and rhythm. No murmurs, gallops, heaves, thrills, or  rubs. No S3 or S4 audible.  ABDOMEN: Soft and nontender. Bowel sounds normoactive. No right upper  quadrant tenderness. No borborygmi.  EXTREMITIES: No clubbing, cyanosis, or edema. No evidence of peripheral  edema.  Peripheral pulses are 1+ and intact bilaterally.  NEUROLOGIC: Cranial nerves III-XII grossly intact. The patient moves all  four extremities.   IMPRESSION:  1.  Syncopal episode presumably vagal after an episode of pain.  2.  Gastritis.  3.  Intravascular volume depletion.  4.  Hypokalemia with a potassium of 3.3.  5.  Fibromyalgia.   PLAN:  Admit. Give IV Zofran and IV fluids and Kay Ciel to first IV liter.  Recheck BMP. Will get a liver profile. Will give her clear liquids and  observe her abdominal findings if any over the next 24 to 36 hours. I will  make further recommendations as the database expands.     Rich   RMD/MEDQ  D:  12/06/2004  T:  12/06/2004  Job:  [id number]

## 2011-04-26 NOTE — Group Therapy Note (Signed)
[name], [name]                ACCOUNT NO.:  [id number]   MEDICAL RECORD NO.:  [id number]          PATIENT TYPE:  INP   LOCATION:  A222                          FACILITY:  [facility]   PHYSICIAN:  Diana Homer. Sudie [name], M.D.DATE OF BIRTH:  12/22/1955   DATE OF PROCEDURE:  12/20/2004  DATE OF DISCHARGE:                                   PROGRESS NOTE   SUBJECTIVE:  The patient feels about the same.  She has a lot of pain all  over first thing in the morning but this is normal for her with her  fibromyalgia.  Denies shortness of breath or leg pain.   PHYSICAL EXAMINATION:  VITAL SIGNS:  Temperature 98 degrees, pulse 63,  respiratory rate 20, blood pressure 90/60.  GENERAL:  She is sitting up in bed with feet on the floor eating breakfast.  She in no acute distress.  Well-developed, well-nourished, alert and  oriented.  Her color is good on oxygen.  The INR was 1.2.  LUNGS:  Clear.  Moving air well.  HEART:  Regular rhythm without murmur rate of 70.  ABDOMEN:  Soft without hepatosplenomegaly, masses or tenderness.  EXTREMITIES:  There is no edema in the ankles. No tenderness of the legs.   ASSESSMENT:  1.  Pulmonary emboli.  2. Fibromyalgia.   PLAN:  Continue with Lovenox __________ mg/kg subcutaneous b.i.d. and  Coumadin which was just started yesterday.  CBC and LFTs pending.  Venous  Dopplers of the leg, done this morning, are pending.      SDK/MEDQ  D:  12/20/2004  T:  12/20/2004  Job:  [id number]

## 2011-12-16 ENCOUNTER — Other Ambulatory Visit (HOSPITAL_COMMUNITY): Payer: Self-pay | Admitting: Family Medicine

## 2011-12-16 DIAGNOSIS — Z139 Encounter for screening, unspecified: Secondary | ICD-10-CM

## 2011-12-19 ENCOUNTER — Ambulatory Visit (HOSPITAL_COMMUNITY)
Admission: RE | Admit: 2011-12-19 | Discharge: 2011-12-19 | Disposition: A | Discharge status: Home / Self Care | Payer: Managed Care, Other (non HMO) | Source: Ambulatory Visit | Attending: Family Medicine | Admitting: Family Medicine

## 2011-12-19 DIAGNOSIS — Z139 Encounter for screening, unspecified: Secondary | ICD-10-CM

## 2011-12-19 DIAGNOSIS — Z1231 Encounter for screening mammogram for malignant neoplasm of breast: Secondary | ICD-10-CM | POA: Insufficient documentation

## 2013-02-15 ENCOUNTER — Other Ambulatory Visit (HOSPITAL_COMMUNITY): Payer: Self-pay | Admitting: Family Medicine

## 2013-02-15 ENCOUNTER — Ambulatory Visit (HOSPITAL_COMMUNITY)
Admission: RE | Admit: 2013-02-15 | Discharge: 2013-02-15 | Disposition: A | Discharge status: Home / Self Care | Payer: Managed Care, Other (non HMO) | Source: Ambulatory Visit | Attending: Family Medicine | Admitting: Family Medicine

## 2013-02-15 DIAGNOSIS — R0602 Shortness of breath: Secondary | ICD-10-CM | POA: Insufficient documentation

## 2013-02-15 DIAGNOSIS — R5381 Other malaise: Secondary | ICD-10-CM

## 2013-02-15 DIAGNOSIS — R5383 Other fatigue: Secondary | ICD-10-CM | POA: Insufficient documentation

## 2013-02-15 LAB — BLOOD GAS, ARTERIAL
FIO2: 21 %
O2 Saturation: 97.7 %
Patient temperature: 37
pH, Arterial: 7.436 (ref 7.350–7.450)

## 2013-03-30 ENCOUNTER — Other Ambulatory Visit (HOSPITAL_COMMUNITY): Payer: Self-pay

## 2013-03-30 DIAGNOSIS — R0602 Shortness of breath: Secondary | ICD-10-CM

## 2013-04-09 ENCOUNTER — Ambulatory Visit (HOSPITAL_COMMUNITY)
Admission: RE | Admit: 2013-04-09 | Discharge: 2013-04-09 | Disposition: A | Discharge status: Home / Self Care | Payer: Managed Care, Other (non HMO) | Source: Ambulatory Visit | Attending: Pulmonary Disease | Admitting: Pulmonary Disease

## 2013-04-09 DIAGNOSIS — R0602 Shortness of breath: Secondary | ICD-10-CM | POA: Insufficient documentation

## 2013-04-09 MED ORDER — ALBUTEROL SULFATE (5 MG/ML) 0.5% IN NEBU
2.5000 mg | INHALATION_SOLUTION | Freq: Once | RESPIRATORY_TRACT | Status: AC
  Administered 2013-04-09: 2.5 mg via RESPIRATORY_TRACT

## 2013-04-16 NOTE — Procedures (Signed)
NAME:  ,                                 ACCOUNT NO.:  MEDICAL RECORD NO.:  [id number]  LOCATION:                                 FACILITY:  PHYSICIAN:  Litsy Epting [name], [name]     DATE OF BIRTH:  DATE OF PROCEDURE: DATE OF DISCHARGE:                           PULMONARY FUNCTION TEST   REASON FOR PULMONARY FUNCTION TESTING:  Shortness of breath.  1. Spirometry is without evidence of ventilatory defect, but does show     evidence of airflow obstruction, most marked in the smaller     airways. 2. Lung volumes are normal. 3. DLCO is normal. 4. Airway resistance is normal. 5. There is some improvement with inhaled bronchodilator, but it does     not reach the level of significance. 6. This study does show airflow obstruction in the smaller airways.     Lunetta Marina [name], [name]     ELH/MEDQ  D:  04/14/2013  T:  04/15/2013  Job:  [id number]  cc:   Mila Homer. Sudie Bailey, [name] Fax: [phone]

## 2013-04-28 NOTE — Procedures (Signed)
[name], LAWRIE                ACCOUNT NO.:  [id number]  MEDICAL RECORD NO.:  [id number]  LOCATION:  RESP                          FACILITY:  [facility]  PHYSICIAN:  Reganne Messerschmidt [name], M.D.DATE OF BIRTH:  1956/06/05  DATE OF PROCEDURE: DATE OF DISCHARGE:  04/09/2013                           PULMONARY FUNCTION TEST   REASON FOR PULMONARY FUNCTION TESTING:  Shortness of breath.  1. Spirometry shows no definite ventilatory defect, but does show     evidence of airflow obstruction. 2. Lung volumes are normal. 3. DLCO is normal. 4. Airway resistance is normal. 5. There is no significant bronchodilator improvement. 6. No definite cause of shortness of breath is seen on this pulmonary     function tests.     [name] [name], [name]     ELH/MEDQ  D:  04/27/2013  T:  04/28/2013  Job:  [id number]

## 2013-04-29 LAB — PULMONARY FUNCTION TEST

## 2014-11-17 ENCOUNTER — Other Ambulatory Visit: Payer: Self-pay

## 2014-11-17 ENCOUNTER — Encounter (HOSPITAL_COMMUNITY)
Admission: RE | Admit: 2014-11-17 | Discharge: 2014-11-17 | Disposition: A | Discharge status: Home / Self Care | Payer: BC Managed Care – PPO | Source: Ambulatory Visit | Attending: Ophthalmology | Admitting: Ophthalmology

## 2014-11-17 ENCOUNTER — Encounter (HOSPITAL_COMMUNITY): Payer: Self-pay

## 2014-11-17 DIAGNOSIS — Z01818 Encounter for other preprocedural examination: Secondary | ICD-10-CM | POA: Diagnosis present

## 2014-11-17 HISTORY — DX: Other specified postprocedural states: Z98.890

## 2014-11-17 HISTORY — DX: Other pulmonary embolism without acute cor pulmonale: I26.99

## 2014-11-17 HISTORY — DX: Unspecified osteoarthritis, unspecified site: M19.90

## 2014-11-17 HISTORY — DX: Type 2 diabetes mellitus without complications: E11.9

## 2014-11-17 HISTORY — DX: Nausea with vomiting, unspecified: R11.2

## 2014-11-17 LAB — BASIC METABOLIC PANEL
Anion gap: 12 (ref 5–15)
BUN: 15 mg/dL (ref 6–23)
CALCIUM: 9.1 mg/dL (ref 8.4–10.5)
CO2: 27 mEq/L (ref 19–32)
CREATININE: 0.93 mg/dL (ref 0.50–1.10)
Chloride: 105 mEq/L (ref 96–112)
GFR calc Af Amer: 78 mL/min — ABNORMAL LOW (ref >90)
GFR, EST NON AFRICAN AMERICAN: 67 mL/min — AB (ref >90)
GLUCOSE: 171 mg/dL — AB (ref 70–99)
Potassium: 4 mEq/L (ref 3.7–5.3)
Sodium: 144 mEq/L (ref 137–147)

## 2014-11-17 LAB — HEMOGLOBIN AND HEMATOCRIT, BLOOD
HCT: 37.4 % (ref 36.0–46.0)
Hemoglobin: 12.5 g/dL (ref 12.0–15.0)

## 2014-11-17 NOTE — Patient Instructions (Signed)
Your procedure is scheduled on: 11/21/2014  Report to [facility] at  630  AM.  Call this number if you have problems the morning of surgery: [phone]   Do not eat food or drink liquids :After Midnight.      Take these medicines the morning of surgery with A SIP OF WATER: hydrocodone, lexapro, lyrica   Do not wear jewelry, make-up or nail polish.  Do not wear lotions, powders, or perfumes.   Do not shave 48 hours prior to surgery.  Do not bring valuables to the hospital.  Contacts, dentures or bridgework may not be worn into surgery.  Leave suitcase in the car. After surgery it may be brought to your room.  For patients admitted to the hospital, checkout time is 11:00 AM the day of discharge.   Patients discharged the day of surgery will not be allowed to drive home.  :     Please read over the following fact sheets that you were given: Coughing and Deep Breathing, Surgical Site Infection Prevention, Anesthesia Post-op Instructions and Care and Recovery After Surgery    Cataract A cataract is a clouding of the lens of the eye. When a lens becomes cloudy, vision is reduced based on the degree and nature of the clouding. Many cataracts reduce vision to some degree. Some cataracts make people more near-sighted as they develop. Other cataracts increase glare. Cataracts that are ignored and become worse can sometimes look white. The white color can be seen through the pupil. CAUSES   Aging. However, cataracts may occur at any age, even in newborns.   Certain drugs.   Trauma to the eye.   Certain diseases such as diabetes.   Specific eye diseases such as chronic inflammation inside the eye or a sudden attack of a rare form of glaucoma.   Inherited or acquired medical problems.  SYMPTOMS   Gradual, progressive drop in vision in the affected eye.   Severe, rapid visual loss. This most often happens when trauma is the cause.  DIAGNOSIS  To detect a cataract, an eye doctor examines  the lens. Cataracts are best diagnosed with an exam of the eyes with the pupils enlarged (dilated) by drops.  TREATMENT  For an early cataract, vision may improve by using different eyeglasses or stronger lighting. If that does not help your vision, surgery is the only effective treatment. A cataract needs to be surgically removed when vision loss interferes with your everyday activities, such as driving, reading, or watching TV. A cataract may also have to be removed if it prevents examination or treatment of another eye problem. Surgery removes the cloudy lens and usually replaces it with a substitute lens (intraocular lens, IOL).  At a time when both you and your doctor agree, the cataract will be surgically removed. If you have cataracts in both eyes, only one is usually removed at a time. This allows the operated eye to heal and be out of danger from any possible problems after surgery (such as infection or poor wound healing). In rare cases, a cataract may be doing damage to your eye. In these cases, your caregiver may advise surgical removal right away. The vast majority of people who have cataract surgery have better vision afterward. HOME CARE INSTRUCTIONS  If you are not planning surgery, you may be asked to do the following:  Use different eyeglasses.   Use stronger or brighter lighting.   Ask your eye doctor about reducing your medicine dose or changing  medicines if it is thought that a medicine caused your cataract. Changing medicines does not make the cataract go away on its own.   Become familiar with your surroundings. Poor vision can lead to injury. Avoid bumping into things on the affected side. You are at a higher risk for tripping or falling.   Exercise extreme care when driving or operating machinery.   Wear sunglasses if you are sensitive to bright light or experiencing problems with glare.  SEEK IMMEDIATE MEDICAL CARE IF:   You have a worsening or sudden vision loss.    You notice redness, swelling, or increasing pain in the eye.   You have a fever.  Document Released: 11/25/2005 Document Revised: 11/14/2011 Document Reviewed: 07/19/2011 [facility] Patient Information 2012 [place], [place].PATIENT INSTRUCTIONS POST-ANESTHESIA  IMMEDIATELY FOLLOWING SURGERY:  Do not drive or operate machinery for the first twenty four hours after surgery.  Do not make any important decisions for twenty four hours after surgery or while taking narcotic pain medications or sedatives.  If you develop intractable nausea and vomiting or a severe headache please notify your doctor immediately.  FOLLOW-UP:  Please make an appointment with your surgeon as instructed. You do not need to follow up with anesthesia unless specifically instructed to do so.  WOUND CARE INSTRUCTIONS (if applicable):  Keep a dry clean dressing on the anesthesia/puncture wound site if there is drainage.  Once the wound has quit draining you may leave it open to air.  Generally you should leave the bandage intact for twenty four hours unless there is drainage.  If the epidural site drains for more than 36-48 hours please call the anesthesia department.  QUESTIONS?:  Please feel free to call your physician or the hospital operator if you have any questions, and they will be happy to assist you.

## 2014-11-18 MED ORDER — LIDOCAINE HCL (PF) 1 % IJ SOLN
INTRAMUSCULAR | Status: AC
  Filled 2014-11-18: qty 2

## 2014-11-18 MED ORDER — TETRACAINE HCL 0.5 % OP SOLN
OPHTHALMIC | Status: AC
  Filled 2014-11-18: qty 2

## 2014-11-18 MED ORDER — NEOMYCIN-POLYMYXIN-DEXAMETH 3.5-10000-0.1 OP SUSP
OPHTHALMIC | Status: AC
  Filled 2014-11-18: qty 5

## 2014-11-18 MED ORDER — CYCLOPENTOLATE-PHENYLEPHRINE OP SOLN OPTIME - NO CHARGE
OPHTHALMIC | Status: AC
  Filled 2014-11-18: qty 2

## 2014-11-18 MED ORDER — LIDOCAINE HCL 3.5 % OP GEL
OPHTHALMIC | Status: AC
  Filled 2014-11-18: qty 1

## 2014-11-18 MED ORDER — PHENYLEPHRINE HCL 2.5 % OP SOLN
OPHTHALMIC | Status: AC
  Filled 2014-11-18: qty 15

## 2014-11-21 ENCOUNTER — Ambulatory Visit (HOSPITAL_COMMUNITY): Payer: BC Managed Care – PPO | Admitting: Anesthesiology

## 2014-11-21 ENCOUNTER — Encounter (HOSPITAL_COMMUNITY): Payer: Self-pay | Admitting: *Deleted

## 2014-11-21 ENCOUNTER — Ambulatory Visit (HOSPITAL_COMMUNITY)
Admission: RE | Admit: 2014-11-21 | Discharge: 2014-11-21 | Disposition: A | Discharge status: Home / Self Care | Payer: BC Managed Care – PPO | Source: Ambulatory Visit | Attending: Ophthalmology | Admitting: Ophthalmology

## 2014-11-21 ENCOUNTER — Encounter (HOSPITAL_COMMUNITY)
Admission: RE | Disposition: A | Discharge status: Home / Self Care | Payer: Self-pay | Source: Ambulatory Visit | Attending: Ophthalmology

## 2014-11-21 DIAGNOSIS — H25811 Combined forms of age-related cataract, right eye: Secondary | ICD-10-CM | POA: Diagnosis present

## 2014-11-21 HISTORY — PX: CATARACT EXTRACTION W/PHACO: SHX586

## 2014-11-21 LAB — GLUCOSE, CAPILLARY: Glucose-Capillary: 148 mg/dL — ABNORMAL HIGH (ref 70–99)

## 2014-11-21 SURGERY — PHACOEMULSIFICATION, CATARACT, WITH IOL INSERTION
Anesthesia: Monitor Anesthesia Care | Site: Eye | Laterality: Right

## 2014-11-21 MED ORDER — POVIDONE-IODINE 5 % OP SOLN
OPHTHALMIC | Status: DC | PRN
Start: 2014-11-21 — End: 2014-11-21
  Administered 2014-11-21: 1 via OPHTHALMIC

## 2014-11-21 MED ORDER — LIDOCAINE 3.5 % OP GEL OPTIME - NO CHARGE
OPHTHALMIC | Status: DC | PRN
  Administered 2014-11-21: 1 [drp] via OPHTHALMIC

## 2014-11-21 MED ORDER — LACTATED RINGERS IV SOLN
INTRAVENOUS | Status: DC
  Administered 2014-11-21: 08:00:00 via INTRAVENOUS

## 2014-11-21 MED ORDER — PROVISC 10 MG/ML IO SOLN
INTRAOCULAR | Status: DC | PRN
  Administered 2014-11-21: 0.85 mL via INTRAOCULAR

## 2014-11-21 MED ORDER — CYCLOPENTOLATE-PHENYLEPHRINE 0.2-1 % OP SOLN
1.0000 [drp] | OPHTHALMIC | Status: AC
  Administered 2014-11-21 (×3): 1 [drp] via OPHTHALMIC

## 2014-11-21 MED ORDER — ONDANSETRON HCL 4 MG/2ML IJ SOLN
INTRAMUSCULAR | Status: AC
  Filled 2014-11-21: qty 2

## 2014-11-21 MED ORDER — TETRACAINE HCL 0.5 % OP SOLN
1.0000 [drp] | OPHTHALMIC | Status: AC
  Administered 2014-11-21 (×3): 1 [drp] via OPHTHALMIC

## 2014-11-21 MED ORDER — ONDANSETRON HCL 4 MG/2ML IJ SOLN
4.0000 mg | Freq: Once | INTRAMUSCULAR | Status: AC
  Administered 2014-11-21: 4 mg via INTRAVENOUS

## 2014-11-21 MED ORDER — MIDAZOLAM HCL 2 MG/2ML IJ SOLN
1.0000 mg | INTRAMUSCULAR | Status: DC | PRN
  Administered 2014-11-21: 2 mg via INTRAVENOUS

## 2014-11-21 MED ORDER — FENTANYL CITRATE 0.05 MG/ML IJ SOLN
INTRAMUSCULAR | Status: AC
  Filled 2014-11-21: qty 2

## 2014-11-21 MED ORDER — PHENYLEPHRINE HCL 2.5 % OP SOLN
1.0000 [drp] | OPHTHALMIC | Status: DC | PRN
  Administered 2014-11-21 (×2): 1 [drp] via OPHTHALMIC

## 2014-11-21 MED ORDER — MIDAZOLAM HCL 2 MG/2ML IJ SOLN
INTRAMUSCULAR | Status: AC
  Filled 2014-11-21: qty 2

## 2014-11-21 MED ORDER — BSS IO SOLN
INTRAOCULAR | Status: DC | PRN
  Administered 2014-11-21: 30 mL via INTRAOCULAR

## 2014-11-21 MED ORDER — LIDOCAINE HCL (PF) 1 % IJ SOLN
INTRAMUSCULAR | Status: DC | PRN
  Administered 2014-11-21: .5 mL

## 2014-11-21 MED ORDER — EPINEPHRINE HCL 1 MG/ML IJ SOLN
INTRAOCULAR | Status: DC | PRN
  Administered 2014-11-21: 08:00:00

## 2014-11-21 MED ORDER — NEOMYCIN-POLYMYXIN-DEXAMETH 3.5-10000-0.1 OP SUSP
OPHTHALMIC | Status: DC | PRN
  Administered 2014-11-21: 1 [drp] via OPHTHALMIC

## 2014-11-21 MED ORDER — LIDOCAINE HCL 3.5 % OP GEL: 1.0000 "application " | Freq: Once | OPHTHALMIC | Status: DC

## 2014-11-21 MED ORDER — FENTANYL CITRATE 0.05 MG/ML IJ SOLN
25.0000 ug | INTRAMUSCULAR | Status: AC
  Administered 2014-11-21 (×2): 25 ug via INTRAVENOUS

## 2014-11-21 MED ORDER — LACTATED RINGERS IV SOLN
INTRAVENOUS | Status: DC | PRN
  Administered 2014-11-21: 08:00:00 via INTRAVENOUS

## 2014-11-21 MED ORDER — EPINEPHRINE HCL 1 MG/ML IJ SOLN
INTRAMUSCULAR | Status: AC
  Filled 2014-11-21: qty 1

## 2014-11-21 SURGICAL SUPPLY — 34 items
CAPSULAR TENSION RING-AMO (OPHTHALMIC RELATED) IMPLANT
CLOTH BEACON ORANGE TIMEOUT ST (SAFETY) ×2 IMPLANT
EYE SHIELD UNIVERSAL CLEAR (GAUZE/BANDAGES/DRESSINGS) ×2 IMPLANT
GLOVE BIO SURGEON STRL SZ 6.5 (GLOVE) IMPLANT
GLOVE BIO SURGEONS STRL SZ 6.5 (GLOVE)
GLOVE BIOGEL PI IND STRL 6.5 (GLOVE) IMPLANT
GLOVE BIOGEL PI IND STRL 7.0 (GLOVE) IMPLANT
GLOVE BIOGEL PI IND STRL 7.5 (GLOVE) IMPLANT
GLOVE BIOGEL PI INDICATOR 6.5 (GLOVE) ×4
GLOVE BIOGEL PI INDICATOR 7.0 (GLOVE)
GLOVE BIOGEL PI INDICATOR 7.5 (GLOVE)
GLOVE ECLIPSE 6.5 STRL STRAW (GLOVE) IMPLANT
GLOVE ECLIPSE 7.0 STRL STRAW (GLOVE) IMPLANT
GLOVE ECLIPSE 7.5 STRL STRAW (GLOVE) IMPLANT
GLOVE EXAM NITRILE LRG STRL (GLOVE) IMPLANT
GLOVE EXAM NITRILE MD LF STRL (GLOVE) IMPLANT
GLOVE SKINSENSE NS SZ6.5 (GLOVE)
GLOVE SKINSENSE NS SZ7.0 (GLOVE)
GLOVE SKINSENSE STRL SZ6.5 (GLOVE) IMPLANT
GLOVE SKINSENSE STRL SZ7.0 (GLOVE) IMPLANT
KIT VITRECTOMY (OPHTHALMIC RELATED) IMPLANT
PAD ARMBOARD 7.5X6 YLW CONV (MISCELLANEOUS) ×2 IMPLANT
PROC W NO LENS (INTRAOCULAR LENS)
PROC W SPEC LENS (INTRAOCULAR LENS)
PROCESS W NO LENS (INTRAOCULAR LENS) IMPLANT
PROCESS W SPEC LENS (INTRAOCULAR LENS) IMPLANT
RETRACTOR IRIS SIGHTPATH (OPHTHALMIC RELATED) IMPLANT
RING MALYGIN (MISCELLANEOUS) IMPLANT
SIGHTPATH CAT PROC W REG LENS (Ophthalmic Related) ×3 IMPLANT
SYRINGE LUER LOK 1CC (MISCELLANEOUS) ×2 IMPLANT
TAPE SURG TRANSPORE 1 IN (GAUZE/BANDAGES/DRESSINGS) IMPLANT
TAPE SURGICAL TRANSPORE 1 IN (GAUZE/BANDAGES/DRESSINGS) ×2
VISCOELASTIC ADDITIONAL (OPHTHALMIC RELATED) IMPLANT
WATER STERILE IRR 250ML POUR (IV SOLUTION) ×2 IMPLANT

## 2014-11-21 NOTE — Anesthesia Procedure Notes (Signed)
Procedure Name: MAC Date/Time: 11/21/2014 7:51 AM Performed by: Pernell Dupre, Jaylei Fuerte A Pre-anesthesia Checklist: Patient identified, Timeout performed, Emergency Drugs available, Suction available and Patient being monitored Oxygen Delivery Method: Nasal cannula

## 2014-11-21 NOTE — Op Note (Signed)
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  Assistants: None  Anesthesia: Topical with MAC  Indications: Painless, progressive loss of vision with compromise of daily activities.  Surgery: Cataract Extraction with Intraocular lens Implant Right Eye  Discription: The patient had dilating drops and viscous lidocaine placed into the Right eye in the pre-op holding area. After transfer to the operating room, a time out was performed. The patient was then prepped and draped. Beginning with a 75 degree blade a paracentesis port was made at the surgeon's 2 o'clock position. The anterior chamber was then filled with 1% non-preserved lidocaine. This was followed by filling the anterior chamber with Provisc.  A 2.81mm keratome blade was used to make a clear corneal incision at the temporal limbus.  A bent cystatome needle was used to create a continuous tear capsulotomy. Hydrodissection was performed with balanced salt solution on a Fine canula. The lens nucleus was then removed using the phacoemulsification handpiece. Residual cortex was removed with the I&A handpiece. The anterior chamber and capsular bag were refilled with Provisc. A posterior chamber intraocular lens was placed into the capsular bag with it's injector. The implant was positioned with the Kuglan hook. The Provisc was then removed from the anterior chamber and capsular bag with the I&A handpiece. Stromal hydration of the main incision and paracentesis port was performed with BSS on a Fine canula. The wounds were tested for leak which was negative. The patient tolerated the procedure well. There were no operative complications. The patient was then transferred to the recovery room in stable condition.  Complications: None  Specimen: None  EBL: None  Prosthetic device: Hoya iSert 250, power 19.0  D, SN [id number].

## 2014-11-21 NOTE — Transfer of Care (Signed)
Immediate Anesthesia Transfer of Care Note  Patient: [name]  Procedure(s) Performed: Procedure(s) with comments: CATARACT EXTRACTION PHACO AND INTRAOCULAR LENS PLACEMENT (IOC) (Right) - CDE:5.78  Patient Location: Short Stay  Anesthesia Type:MAC  Level of Consciousness: awake, alert , oriented and patient cooperative  Airway & Oxygen Therapy: Patient Spontanous Breathing  Post-op Assessment: Report given to PACU RN and Post -op Vital signs reviewed and stable  Post vital signs: Reviewed and stable  Complications: No apparent anesthesia complications

## 2014-11-21 NOTE — Discharge Instructions (Signed)

## 2014-11-21 NOTE — H&P (Signed)
I have reviewed the H&P, the patient was re-examined, and I have identified no interval changes in medical condition and plan of care since the history and physical of record  

## 2014-11-21 NOTE — Anesthesia Preprocedure Evaluation (Signed)
Anesthesia Evaluation  Patient identified by MRN, date of birth, ID band Patient awake    Reviewed: Allergy & Precautions, H&P , NPO status , Patient's Chart, lab work & pertinent test results  History of Anesthesia Complications (+) PONV and history of anesthetic complications  Airway Mallampati: II  TM Distance: >3 FB     Dental  (+) Teeth Intact   Pulmonary PE (2006) breath sounds clear to auscultation        Cardiovascular negative cardio ROS  Rhythm:Regular     Neuro/Psych    GI/Hepatic negative GI ROS,   Endo/Other  diabetes, Well Controlled, Type 2, Oral Hypoglycemic Agents  Renal/GU      Musculoskeletal  (+) Arthritis -,   Abdominal   Peds  Hematology   Anesthesia Other Findings   Reproductive/Obstetrics                             Anesthesia Physical Anesthesia Plan  ASA: III  Anesthesia Plan: MAC   Post-op Pain Management:    Induction: Intravenous  Airway Management Planned: Nasal Cannula  Additional Equipment:   Intra-op Plan:   Post-operative Plan:   Informed Consent: I have reviewed the patients History and Physical, chart, labs and discussed the procedure including the risks, benefits and alternatives for the proposed anesthesia with the patient or authorized representative who has indicated his/her understanding and acceptance.     Plan Discussed with:   Anesthesia Plan Comments:         Anesthesia Quick Evaluation

## 2014-11-21 NOTE — Anesthesia Postprocedure Evaluation (Signed)
  Anesthesia Post-op Note  Patient: [name]  Procedure(s) Performed: Procedure(s) with comments: CATARACT EXTRACTION PHACO AND INTRAOCULAR LENS PLACEMENT (IOC) (Right) - CDE:5.78  Patient Location: Short Stay  Anesthesia Type:MAC  Level of Consciousness: awake, alert , oriented and patient cooperative  Airway and Oxygen Therapy: Patient Spontanous Breathing  Post-op Pain: none  Post-op Assessment: Post-op Vital signs reviewed, Patient's Cardiovascular Status Stable, Respiratory Function Stable, Patent Airway, No signs of Nausea or vomiting and Pain level controlled  Post-op Vital Signs: Reviewed and stable  Last Vitals:  Filed Vitals:   11/21/14 0714  BP: 123/77  Pulse: 65  Temp: 37 C  Resp: 16    Complications: No apparent anesthesia complications

## 2014-11-23 ENCOUNTER — Encounter (HOSPITAL_COMMUNITY): Payer: Self-pay | Admitting: Ophthalmology

## 2014-11-25 ENCOUNTER — Other Ambulatory Visit (HOSPITAL_COMMUNITY): Payer: Self-pay | Admitting: Family Medicine

## 2014-11-25 DIAGNOSIS — Z139 Encounter for screening, unspecified: Secondary | ICD-10-CM

## 2014-11-28 ENCOUNTER — Ambulatory Visit (HOSPITAL_COMMUNITY)
Admission: RE | Admit: 2014-11-28 | Discharge: 2014-11-28 | Disposition: A | Discharge status: Home / Self Care | Payer: BC Managed Care – PPO | Source: Ambulatory Visit | Attending: Family Medicine | Admitting: Family Medicine

## 2014-11-28 DIAGNOSIS — Z139 Encounter for screening, unspecified: Secondary | ICD-10-CM

## 2014-11-28 DIAGNOSIS — Z1231 Encounter for screening mammogram for malignant neoplasm of breast: Secondary | ICD-10-CM | POA: Insufficient documentation

## 2015-04-03 ENCOUNTER — Other Ambulatory Visit (HOSPITAL_COMMUNITY): Payer: Self-pay | Admitting: Family Medicine

## 2015-04-03 DIAGNOSIS — N644 Mastodynia: Secondary | ICD-10-CM

## 2015-04-04 ENCOUNTER — Other Ambulatory Visit (HOSPITAL_COMMUNITY): Payer: Self-pay | Admitting: Family Medicine

## 2015-04-04 DIAGNOSIS — N644 Mastodynia: Secondary | ICD-10-CM

## 2015-04-18 ENCOUNTER — Ambulatory Visit (HOSPITAL_COMMUNITY)
Admission: RE | Admit: 2015-04-18 | Discharge: 2015-04-18 | Disposition: A | Discharge status: Home / Self Care | Payer: BLUE CROSS/BLUE SHIELD | Source: Ambulatory Visit | Attending: Family Medicine | Admitting: Family Medicine

## 2015-04-18 DIAGNOSIS — N644 Mastodynia: Secondary | ICD-10-CM

## 2015-06-28 ENCOUNTER — Encounter (HOSPITAL_COMMUNITY): Payer: Self-pay | Admitting: Emergency Medicine

## 2015-06-28 ENCOUNTER — Emergency Department (HOSPITAL_COMMUNITY)
Admission: EM | Admit: 2015-06-28 | Discharge: 2015-06-28 | Disposition: A | Discharge status: Home / Self Care | Payer: BLUE CROSS/BLUE SHIELD | Attending: Emergency Medicine | Admitting: Emergency Medicine

## 2015-06-28 ENCOUNTER — Emergency Department (HOSPITAL_COMMUNITY): Payer: BLUE CROSS/BLUE SHIELD

## 2015-06-28 DIAGNOSIS — Z7951 Long term (current) use of inhaled steroids: Secondary | ICD-10-CM | POA: Insufficient documentation

## 2015-06-28 DIAGNOSIS — S61211A Laceration without foreign body of left index finger without damage to nail, initial encounter: Secondary | ICD-10-CM | POA: Diagnosis not present

## 2015-06-28 DIAGNOSIS — E119 Type 2 diabetes mellitus without complications: Secondary | ICD-10-CM | POA: Insufficient documentation

## 2015-06-28 DIAGNOSIS — Y9389 Activity, other specified: Secondary | ICD-10-CM | POA: Diagnosis not present

## 2015-06-28 DIAGNOSIS — S61219A Laceration without foreign body of unspecified finger without damage to nail, initial encounter: Secondary | ICD-10-CM

## 2015-06-28 DIAGNOSIS — Y9289 Other specified places as the place of occurrence of the external cause: Secondary | ICD-10-CM | POA: Insufficient documentation

## 2015-06-28 DIAGNOSIS — Z86711 Personal history of pulmonary embolism: Secondary | ICD-10-CM | POA: Diagnosis not present

## 2015-06-28 DIAGNOSIS — M1990 Unspecified osteoarthritis, unspecified site: Secondary | ICD-10-CM | POA: Diagnosis not present

## 2015-06-28 DIAGNOSIS — Z79899 Other long term (current) drug therapy: Secondary | ICD-10-CM | POA: Diagnosis not present

## 2015-06-28 DIAGNOSIS — Z23 Encounter for immunization: Secondary | ICD-10-CM | POA: Diagnosis not present

## 2015-06-28 DIAGNOSIS — Y998 Other external cause status: Secondary | ICD-10-CM | POA: Insufficient documentation

## 2015-06-28 DIAGNOSIS — W861XXA Exposure to industrial wiring, appliances and electrical machinery, initial encounter: Secondary | ICD-10-CM | POA: Diagnosis not present

## 2015-06-28 DIAGNOSIS — S6992XA Unspecified injury of left wrist, hand and finger(s), initial encounter: Secondary | ICD-10-CM | POA: Diagnosis present

## 2015-06-28 MED ORDER — CEPHALEXIN 500 MG PO CAPS: 500.0000 mg | ORAL_CAPSULE | Freq: Four times a day (QID) | ORAL | Status: DC

## 2015-06-28 MED ORDER — TETANUS-DIPHTH-ACELL PERTUSSIS 5-2.5-18.5 LF-MCG/0.5 IM SUSP
0.5000 mL | Freq: Once | INTRAMUSCULAR | Status: AC
  Administered 2015-06-28: 0.5 mL via INTRAMUSCULAR
  Filled 2015-06-28: qty 0.5

## 2015-06-28 MED ORDER — LIDOCAINE HCL (PF) 2 % IJ SOLN
10.0000 mL | Freq: Once | INTRAMUSCULAR | Status: AC
  Administered 2015-06-28: 10 mL
  Filled 2015-06-28: qty 10

## 2015-06-28 NOTE — ED Provider Notes (Signed)
CSN: [id number]     Arrival date & time 06/28/15  1226 History   First MD Initiated Contact with Patient 06/28/15 1351     Chief Complaint  Patient presents with  . Finger Injury     (Consider location/radiation/quality/duration/timing/severity/associated sxs/prior Treatment) HPI   [name] is a 59 y.o. female who presents to the Emergency Department complaining of laceration to her distal left index finger.  She states she was using a pressure washer and accidentally cut her finger under the pressure of the water.  Describes a throbbing pain to her finger.  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  She denies difficulty moving her finger, persistent bleeding and swelling.  Last td is unknown.    Past Medical History  Diagnosis Date  . Diabetes mellitus without complication   . Arthritis   . Pulmonary embolism 2005  . PONV (postoperative nausea and vomiting)    Past Surgical History  Procedure Laterality Date  . Carpal tunnel release Left 2000  . Carpal tunnel release Right 2000  . Cataract extraction w/phaco Right 11/21/2014    Procedure: CATARACT EXTRACTION PHACO AND INTRAOCULAR LENS PLACEMENT (IOC);  Surgeon: Gemma Payor, MD;  Location: [facility];  Service: Ophthalmology;  Laterality: Right;  CDE:5.78  . Abdominal hysterectomy     History reviewed. No pertinent family history. History  Substance Use Topics  . Smoking status: Never Smoker   . Smokeless tobacco: Not on file  . Alcohol Use: No   OB History    No data available     Review of Systems  Constitutional: Negative for fever and chills.  Musculoskeletal: Negative for back pain, joint swelling and arthralgias.  Skin: Positive for wound.       Laceration   Neurological: Negative for dizziness, weakness and numbness.  Hematological: Does not bruise/bleed easily.  All other systems reviewed and are negative.     Allergies  Aspirin  Home Medications   Prior to Admission  medications   Medication Sig Start Date End Date Taking? Authorizing Provider  cholecalciferol (VITAMIN D) 1000 UNITS tablet Take 1,000 Units by mouth daily.   Yes Historical Provider, MD  escitalopram (LEXAPRO) 20 MG tablet Take 10 mg by mouth daily.   Yes Historical Provider, MD  Eszopiclone 3 MG TABS Take 3 mg by mouth at bedtime. Take immediately before bedtime   Yes Historical Provider, MD  fluticasone (FLONASE) 50 MCG/ACT nasal spray Place 1 spray into both nostrils daily. 05/09/15  Yes Historical Provider, MD  HYDROcodone-acetaminophen (NORCO) 10-325 MG per tablet Take 1 tablet by mouth 5 (five) times daily as needed for severe pain.   Yes Historical Provider, MD  hydroxychloroquine (PLAQUENIL) 200 MG tablet Take 400 mg by mouth daily.   Yes Historical Provider, MD  lovastatin (MEVACOR) 20 MG tablet Take 20 mg by mouth at bedtime.   Yes Historical Provider, MD  metFORMIN (GLUCOPHAGE) 1000 MG tablet Take 1,000 mg by mouth daily with breakfast.   Yes Historical Provider, MD  pregabalin (LYRICA) 75 MG capsule Take 75 mg by mouth 3 (three) times daily.   Yes Historical Provider, MD   BP 109/68 mmHg  Pulse 74  Temp(Src) 97.9 F (36.6 C) (Oral)  Resp 18  Ht 5\' 8"  (1.727 m)  Wt 172 lb (78.019 kg)  BMI 26.16 kg/m2  SpO2 100% Physical Exam  Constitutional: She is oriented to person, place, and time. She appears well-developed and well-nourished. No distress.  HENT:  Head: Normocephalic and atraumatic.  Cardiovascular: Normal rate, regular rhythm, normal heart sounds and intact distal pulses.   No murmur heard. Pulmonary/Chest: Effort normal and breath sounds normal. No respiratory distress.  Musculoskeletal: She exhibits no edema or tenderness.  Neurological: She is alert and oriented to person, place, and time. She exhibits normal muscle tone. Coordination normal.  Skin: Skin is warm. Laceration noted.  1.5 cm Laceration to the palmar aspect of the distal left index finger.  Bleeding  controlled,  Pt has full ROM of the finger, distal sensation intact.  No FB's or edema.    Nursing note and vitals reviewed.   ED Course  Procedures (including critical care time) Labs Review Labs Reviewed - No data to display  Imaging Review Dg Finger Index Left  06/28/2015   CLINICAL DATA:  59 year old female with a history of laceration.  EXAM: LEFT INDEX FINGER 2+V  COMPARISON:  None.  FINDINGS: No acute bony abnormality. Gas within the soft tissues overlying the distal phalanx of the first finger. No radiopaque foreign body.  IMPRESSION: No acute bony abnormality identified.  No radiopaque foreign body.  Soft tissue swelling with gas overlying the distal phalanx of the first finger, compatible with given history of laceration.  Signed,  Yvone Neu. Loreta Ave, DO  [facility]  [facility]   Electronically Signed   By: Gilmer Mor D.O.   On: 06/28/2015 12:57     EKG Interpretation None       LACERATION REPAIR Performed by: Johnay Mano L. Authorized by: Maxwell Caul Consent: Verbal consent obtained. Risks and benefits: risks, benefits and alternatives were discussed Consent given by: patient Patient identity confirmed: provided demographic data Prepped and Draped in normal sterile fashion Wound explored  Laceration Location: distal left index finger Laceration Length: 1.5 cm  No Foreign Bodies seen or palpated.  Wound explored and no injury seen to deep structures of the finger      Anesthesia: digital block Local anesthetic: lidocaine 2 % w/o epinephrine  Anesthetic total: 2 ml  Irrigation method: syringe Amount of cleaning: standard  Skin closure: 4-0 prolene  Number of sutures: 4  Technique: simple interrupted  Patient tolerance: Patient tolerated the procedure well with no immediate complications.  MDM   Final diagnoses:  Finger laceration, initial encounter    Td updated.  Wound edges loosely  approximated,.  Remains NV intact.  Has full ROM of the distal finger. Advised to return to er for any signs of infection   Pauline Aus, PA-C 06/28/15 2057  Azalia Bilis, MD 07/03/15 [phone]

## 2015-06-28 NOTE — Discharge Instructions (Signed)

## 2015-06-28 NOTE — ED Notes (Signed)
Pt states that she cut her left index finger with a pressure washer.  No bleeding at triage.

## 2015-10-30 ENCOUNTER — Other Ambulatory Visit (HOSPITAL_COMMUNITY): Payer: Self-pay | Admitting: Family Medicine

## 2015-10-30 DIAGNOSIS — Z1231 Encounter for screening mammogram for malignant neoplasm of breast: Secondary | ICD-10-CM

## 2015-12-01 ENCOUNTER — Ambulatory Visit (HOSPITAL_COMMUNITY)
Admission: RE | Admit: 2015-12-01 | Discharge: 2015-12-01 | Disposition: A | Discharge status: Home / Self Care | Payer: BLUE CROSS/BLUE SHIELD | Source: Ambulatory Visit | Attending: Family Medicine | Admitting: Family Medicine

## 2015-12-01 DIAGNOSIS — Z1231 Encounter for screening mammogram for malignant neoplasm of breast: Secondary | ICD-10-CM

## 2016-12-31 ENCOUNTER — Ambulatory Visit: Payer: Self-pay | Admitting: Orthopedic Surgery

## 2017-01-08 ENCOUNTER — Encounter (HOSPITAL_COMMUNITY)
Admission: RE | Admit: 2017-01-08 | Discharge: 2017-01-08 | Disposition: A | Discharge status: Home / Self Care | Payer: BLUE CROSS/BLUE SHIELD | Source: Ambulatory Visit | Attending: Orthopedic Surgery | Admitting: Orthopedic Surgery

## 2017-01-08 DIAGNOSIS — M1711 Unilateral primary osteoarthritis, right knee: Secondary | ICD-10-CM | POA: Diagnosis not present

## 2017-01-08 DIAGNOSIS — E119 Type 2 diabetes mellitus without complications: Secondary | ICD-10-CM | POA: Diagnosis not present

## 2017-01-08 DIAGNOSIS — Z01818 Encounter for other preprocedural examination: Secondary | ICD-10-CM | POA: Insufficient documentation

## 2017-01-08 DIAGNOSIS — Z01812 Encounter for preprocedural laboratory examination: Secondary | ICD-10-CM | POA: Diagnosis not present

## 2017-01-08 DIAGNOSIS — Z0183 Encounter for blood typing: Secondary | ICD-10-CM | POA: Diagnosis not present

## 2017-01-08 LAB — COMPREHENSIVE METABOLIC PANEL
ALT: 18 U/L (ref 14–54)
AST: 20 U/L (ref 15–41)
Albumin: 4.6 g/dL (ref 3.5–5.0)
Alkaline Phosphatase: 86 U/L (ref 38–126)
Anion gap: 9 (ref 5–15)
BILIRUBIN TOTAL: 0.6 mg/dL (ref 0.3–1.2)
BUN: 12 mg/dL (ref 6–20)
CHLORIDE: 106 mmol/L (ref 101–111)
CO2: 26 mmol/L (ref 22–32)
CREATININE: 0.77 mg/dL (ref 0.44–1.00)
Calcium: 9.3 mg/dL (ref 8.9–10.3)
Glucose, Bld: 134 mg/dL — ABNORMAL HIGH (ref 65–99)
POTASSIUM: 4.2 mmol/L (ref 3.5–5.1)
Sodium: 141 mmol/L (ref 135–145)
TOTAL PROTEIN: 7.2 g/dL (ref 6.5–8.1)

## 2017-01-08 LAB — CBC
HCT: 39.7 % (ref 36.0–46.0)
Hemoglobin: 13.1 g/dL (ref 12.0–15.0)
MCH: 28.9 pg (ref 26.0–34.0)
MCHC: 33 g/dL (ref 30.0–36.0)
MCV: 87.6 fL (ref 78.0–100.0)
Platelets: 205 10*3/uL (ref 150–400)
RBC: 4.53 MIL/uL (ref 3.87–5.11)
RDW: 12.9 % (ref 11.5–15.5)
WBC: 7.1 10*3/uL (ref 4.0–10.5)

## 2017-01-08 LAB — ABO/RH: ABO/RH(D): A POS

## 2017-01-08 LAB — GLUCOSE, CAPILLARY: Glucose-Capillary: 120 mg/dL — ABNORMAL HIGH (ref 65–99)

## 2017-01-08 LAB — SURGICAL PCR SCREEN
MRSA, PCR: NEGATIVE
Staphylococcus aureus: NEGATIVE

## 2017-01-08 LAB — PROTIME-INR
INR: 0.97
PROTHROMBIN TIME: 12.8 s (ref 11.4–15.2)

## 2017-01-08 LAB — APTT: aPTT: 26 seconds (ref 24–36)

## 2017-01-08 NOTE — Patient Instructions (Addendum)
TASHEMA TILLER  01/08/2017   Your procedure is scheduled on: Monday 01/13/2017  Report to [facility] Main  Entrance take [facility]  elevators to 3rd floor to  Short Stay Center at 0600  AM.  Call this number if you have problems the morning of surgery [phone]   Remember: ONLY 1 PERSON MAY GO WITH YOU TO SHORT STAY TO GET  READY MORNING OF YOUR SURGERY.   Do not eat food or drink liquids :After Midnight.     Take these medicines the morning of surgery with A SIP OF WATER: Albuterol inhaler if needed, Pregabalin (Lyrica), Restasis eye drops         How to Manage Your Diabetes Before and After Surgery  Why is it important to control my blood sugar before and after surgery? . Improving blood sugar levels before and after surgery helps healing and can limit problems. . A way of improving blood sugar control is eating a healthy diet by: o  Eating less sugar and carbohydrates o  Increasing activity/exercise o  Talking with your doctor about reaching your blood sugar goals . High blood sugars (greater than 180 mg/dL) can raise your risk of infections and slow your recovery, so you will need to focus on controlling your diabetes during the weeks before surgery. . Make sure that the doctor who takes care of your diabetes knows about your planned surgery including the date and location.  How do I manage my blood sugar before surgery? . Check your blood sugar at least 4 times a day, starting 2 days before surgery, to make sure that the level is not too high or low. o Check your blood sugar the morning of your surgery when you wake up and every 2 hours until you get to the Short Stay unit. . If your blood sugar is less than 70 mg/dL, you will need to treat for low blood sugar: o Do not take insulin. o Treat a low blood sugar (less than 70 mg/dL) with  cup of clear juice (cranberry or apple), 4 glucose tablets, OR glucose gel. o Recheck blood sugar in 15 minutes after  treatment (to make sure it is greater than 70 mg/dL). If your blood sugar is not greater than 70 mg/dL on recheck, call [phone] for further instructions. . Report your blood sugar to the short stay nurse when you get to Short Stay.  . If you are admitted to the hospital after surgery: o Your blood sugar will be checked by the staff and you will probably be given insulin after surgery (instead of oral diabetes medicines) to make sure you have good blood sugar levels. o The goal for blood sugar control after surgery is 80-180 mg/dL.   WHAT DO I DO ABOUT MY DIABETES MEDICATION?        TAKE METFORMIN MEDICATION AS USUAL THE NIGHT BEFORE SURGERY!  . Do not take oral diabetes medicines (pills) the morning of surgery.!     DO NOT TAKE ANY DIABETIC MEDICATIONS DAY OF YOUR SURGERY                               You may not have any metal on your body including hair pins and              piercings  Do not wear jewelry, make-up, lotions, powders  or perfumes, deodorant             Do not wear nail polish.  Do not shave  48 hours prior to surgery.              Men may shave face and neck.   Do not bring valuables to the hospital. [facility] IS NOT             RESPONSIBLE   FOR VALUABLES.  Contacts, dentures or bridgework may not be worn into surgery.  Leave suitcase in the car. After surgery it may be brought to your room.                  Please read over the following fact sheets you were given: _____________________________________________________________________             [facility] - Preparing for Surgery Before surgery, you can play an important role.  Because skin is not sterile, your skin needs to be as free of germs as possible.  You can reduce the number of germs on your skin by washing with CHG (chlorahexidine gluconate) soap before surgery.  CHG is an antiseptic cleaner which kills germs and bonds with the skin to continue killing germs even after washing. Please DO NOT  use if you have an allergy to CHG or antibacterial soaps.  If your skin becomes reddened/irritated stop using the CHG and inform your nurse when you arrive at Short Stay. Do not shave (including legs and underarms) for at least 48 hours prior to the first CHG shower.  You may shave your face/neck. Please follow these instructions carefully:  1.  Shower with CHG Soap the night before surgery and the  morning of Surgery.  2.  If you choose to wash your hair, wash your hair first as usual with your  normal  shampoo.  3.  After you shampoo, rinse your hair and body thoroughly to remove the  shampoo.                           4.  Use CHG as you would any other liquid soap.  You can apply chg directly  to the skin and wash                       Gently with a scrungie or clean washcloth.  5.  Apply the CHG Soap to your body ONLY FROM THE NECK DOWN.   Do not use on face/ open                           Wound or open sores. Avoid contact with eyes, ears mouth and genitals (private parts).                       Wash face,  Genitals (private parts) with your normal soap.             6.  Wash thoroughly, paying special attention to the area where your surgery  will be performed.  7.  Thoroughly rinse your body with warm water from the neck down.  8.  DO NOT shower/wash with your normal soap after using and rinsing off  the CHG Soap.                9.  Pat yourself dry with a clean towel.  10.  Wear clean pajamas.            11.  Place clean sheets on your bed the night of your first shower and do not  sleep with pets. Day of Surgery : Do not apply any lotions/deodorants the morning of surgery.  Please wear clean clothes to the hospital/surgery center.  FAILURE TO FOLLOW THESE INSTRUCTIONS MAY RESULT IN THE CANCELLATION OF YOUR SURGERY PATIENT SIGNATURE_________________________________  NURSE  SIGNATURE__________________________________  ________________________________________________________________________   Adam Phenix  An incentive spirometer is a tool that can help keep your lungs clear and active. This tool measures how well you are filling your lungs with each breath. Taking long deep breaths may help reverse or decrease the chance of developing breathing (pulmonary) problems (especially infection) following:  A long period of time when you are unable to move or be active. BEFORE THE PROCEDURE   If the spirometer includes an indicator to show your best effort, your nurse or respiratory therapist will set it to a desired goal.  If possible, sit up straight or lean slightly forward. Try not to slouch.  Hold the incentive spirometer in an upright position. INSTRUCTIONS FOR USE  1. Sit on the edge of your bed if possible, or sit up as far as you can in bed or on a chair. 2. Hold the incentive spirometer in an upright position. 3. Breathe out normally. 4. Place the mouthpiece in your mouth and seal your lips tightly around it. 5. Breathe in slowly and as deeply as possible, raising the piston or the ball toward the top of the column. 6. Hold your breath for 3-5 seconds or for as long as possible. Allow the piston or ball to fall to the bottom of the column. 7. Remove the mouthpiece from your mouth and breathe out normally. 8. Rest for a few seconds and repeat Steps 1 through 7 at least 10 times every 1-2 hours when you are awake. Take your time and take a few normal breaths between deep breaths. 9. The spirometer may include an indicator to show your best effort. Use the indicator as a goal to work toward during each repetition. 10. After each set of 10 deep breaths, practice coughing to be sure your lungs are clear. If you have an incision (the cut made at the time of surgery), support your incision when coughing by placing a pillow or rolled up towels firmly  against it. Once you are able to get out of bed, walk around indoors and cough well. You may stop using the incentive spirometer when instructed by your caregiver.  RISKS AND COMPLICATIONS  Take your time so you do not get dizzy or light-headed.  If you are in pain, you may need to take or ask for pain medication before doing incentive spirometry. It is harder to take a deep breath if you are having pain. AFTER USE  Rest and breathe slowly and easily.  It can be helpful to keep track of a log of your progress. Your caregiver can provide you with a simple table to help with this. If you are using the spirometer at home, follow these instructions: [place] IF:   You are having difficultly using the spirometer.  You have trouble using the spirometer as often as instructed.  Your pain medication is not giving enough relief while using the spirometer.  You develop fever of 100.5 F (38.1 C) or higher. SEEK IMMEDIATE MEDICAL CARE IF:   You cough up bloody  sputum that had not been present before.  You develop fever of 102 F (38.9 C) or greater.  You develop worsening pain at or near the incision site. MAKE SURE YOU:   Understand these instructions.  Will watch your condition.  Will get help right away if you are not doing well or get worse. Document Released: 04/07/2007 Document Revised: 02/17/2012 Document Reviewed: 06/08/2007 Exit[employer] Patient Information 2014 Exit[employer], [place].   ________________________________________________________________________  WHAT IS A BLOOD TRANSFUSION? Blood Transfusion Information  A transfusion is the replacement of blood or some of its parts. Blood is made up of multiple cells which provide different functions.  Red blood cells carry oxygen and are used for blood loss replacement.  White blood cells fight against infection.  Platelets control bleeding.  Plasma helps clot blood.  Other blood products are available for  specialized needs, such as hemophilia or other clotting disorders. BEFORE THE TRANSFUSION  Who gives blood for transfusions?   Healthy volunteers who are fully evaluated to make sure their blood is safe. This is blood bank blood. Transfusion therapy is the safest it has ever been in the practice of medicine. Before blood is taken from a donor, a complete history is taken to make sure that person has no history of diseases nor engages in risky social behavior (examples are intravenous drug use or sexual activity with multiple partners). The donor's travel history is screened to minimize risk of transmitting infections, such as malaria. The donated blood is tested for signs of infectious diseases, such as HIV and hepatitis. The blood is then tested to be sure it is compatible with you in order to minimize the chance of a transfusion reaction. If you or a relative donates blood, this is often done in anticipation of surgery and is not appropriate for emergency situations. It takes many days to process the donated blood. RISKS AND COMPLICATIONS Although transfusion therapy is very safe and saves many lives, the main dangers of transfusion include:   Getting an infectious disease.  Developing a transfusion reaction. This is an allergic reaction to something in the blood you were given. Every precaution is taken to prevent this. The decision to have a blood transfusion has been considered carefully by your caregiver before blood is given. Blood is not given unless the benefits outweigh the risks. AFTER THE TRANSFUSION  Right after receiving a blood transfusion, you will usually feel much better and more energetic. This is especially true if your red blood cells have gotten low (anemic). The transfusion raises the level of the red blood cells which carry oxygen, and this usually causes an energy increase.  The nurse administering the transfusion will monitor you carefully for complications. HOME CARE  INSTRUCTIONS  No special instructions are needed after a transfusion. You may find your energy is better. Speak with your caregiver about any limitations on activity for underlying diseases you may have. SEEK MEDICAL CARE IF:   Your condition is not improving after your transfusion.  You develop redness or irritation at the intravenous (IV) site. SEEK IMMEDIATE MEDICAL CARE IF:  Any of the following symptoms occur over the next 12 hours:  Shaking chills.  You have a temperature by mouth above 102 F (38.9 C), not controlled by medicine.  Chest, back, or muscle pain.  People around you feel you are not acting correctly or are confused.  Shortness of breath or difficulty breathing.  Dizziness and fainting.  You get a rash or develop hives.  You have a  decrease in urine output.  Your urine turns a dark color or changes to pink, red, or brown. Any of the following symptoms occur over the next 10 days:  You have a temperature by mouth above 102 F (38.9 C), not controlled by medicine.  Shortness of breath.  Weakness after normal activity.  The white part of the eye turns yellow (jaundice).  You have a decrease in the amount of urine or are urinating less often.  Your urine turns a dark color or changes to pink, red, or brown. Document Released: 11/22/2000 Document Revised: 02/17/2012 Document Reviewed: 07/11/2008 [facility] Patient Information 2014 [place], [place].  _______________________________________________________________________

## 2017-01-09 LAB — HEMOGLOBIN A1C
Hgb A1c MFr Bld: 7 % — ABNORMAL HIGH (ref 4.8–5.6)
MEAN PLASMA GLUCOSE: 154 mg/dL

## 2017-01-12 ENCOUNTER — Ambulatory Visit: Payer: Self-pay | Admitting: Orthopedic Surgery

## 2017-01-12 NOTE — H&P (Signed)
[name] DOB: 1956/10/05 Married / Language: English / Race: White Female Date of Admission: 01/13/2017 CC:  Right Knee Pain History of Present Illness The patient is a 61 year old female who comes in for a preoperative History and Physical. The patient is scheduled for a right total knee arthroplasty to be performed by Dr. Gus Rankin. Aluisio, MD at [facility] on 01-13-2017. The patient is a 61 year old female who presented for follow up of their knee. The patient is being followed for their bilateral knee pain and osteoarthritis. They are now month(s) out from Euflexxa series. Symptoms reported include: pain, swelling, stiffness, giving way and difficulty arising from chair. The patient feels that they are doing poorly and report their pain level to be mild to moderate. The following medication has been used for pain control: Hydrocodone. The patient has reported improvement of their symptoms with: viscosupplementation (relief for about 8 weeks). Pt. has been falling lately due to knee's Unfortunately, both knees are getting progressively worse. She said they hurt equally. She has had cortisone and viscosupplement injections would any kind of long lasting benefit. She is at a stage now she wants to be able to do more and her knees are preventing her from doing so. She is ready toget the knees fixed. She will proceed with the right knee. They have been treated conservatively in the past for the above stated problem and despite conservative measures, they continue to have progressive pain and severe functional limitations and dysfunction. They have failed non-operative management including home exercise, medications, and injections. It is felt that they would benefit from undergoing total joint replacement. Risks and benefits of the procedure have been discussed with the patient and they elect to proceed with surgery. There are no active contraindications to surgery such as ongoing infection or  rapidly progressive neurological disease.  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)  Primary osteoarthritis of both knees (M17.0)  Rheumatoid Arthritis  Diabetes Mellitus, Type II  Chronic Pain  Asthma  Cataract  Menopause  Scarlet Fever  Pulmonary Embolism  Blood Clot  Chronic Dry Eyes  Allergies No Known Drug Allergies   Family History Cancer  mother Cerebrovascular Accident  grandmother fathers side Diabetes Mellitus  father and grandmother fathers side Heart Disease  grandfather fathers side Hypertension  mother Liver Disease, Chronic  brother  Social History Alcohol use  never consumed alcohol Children  0 Current work status  working full time Drug/Alcohol Rehab (Currently)  no Drug/Alcohol Rehab (Previously)  no Exercise  Exercises daily; does other Illicit drug use  no Living situation  live with spouse Marital status  married Number of flights of stairs before winded  2-3 Tobacco / smoke exposure  yes Tobacco use  former smoker; smoke(d) less than 1/2 pack(s) per day Advance Directives  Living Will  Medication History Fluticasone Propionate (50MCG/ACT Suspension, Nasal) Active. Restasis (0.05% Emulsion, Ophthalmic) Active. Hydrocodone-Acetaminophen (10-325MG  Tablet, Oral) Active. Lyrica (75MG  Capsule, Oral) Active. Lovastatin (20MG  Tablet, Oral) Active. MetFORMIN HCl ER (OSM) (1000MG  Tablet ER 24HR, Oral) Active. Hydroxychloroquine Sulfate (200MG  Tablet, Oral) Active. Escitalopram Oxalate (20MG  Tablet, 1/2 Oral daily) Active. Lunesta (3MG  Tablet, Oral) Active. Prolensa (0.07% Solution, Ophthalmic) Active.  Past Surgical History  Breast Biopsy  left Carpal Tunnel Repair  bilateral; 1990's Cataract Surgery  Date: 2013. right Hysterectomy  Date: 61. partial (non-cancerous)     Review of Systems  General Present- Fatigue. Not Present- Chills, Fever, Memory Loss,  Night  Sweats, Weight Gain and Weight Loss. Skin Not Present- Eczema, Hives, Itching, Lesions and Rash. HEENT Not Present- Dentures, Double Vision, Headache, Hearing Loss, Tinnitus and Visual Loss. Respiratory Not Present- Allergies, Chronic Cough, Coughing up blood, Shortness of breath at rest and Shortness of breath with exertion. Cardiovascular Not Present- Chest Pain, Difficulty Breathing Lying Down, Murmur, Palpitations, Racing/skipping heartbeats and Swelling. Gastrointestinal Not Present- Abdominal Pain, Bloody Stool, Constipation, Diarrhea, Difficulty Swallowing, Heartburn, Jaundice, Loss of appetitie, Nausea and Vomiting. Female Genitourinary Not Present- Blood in Urine, Discharge, Flank Pain, Incontinence, Painful Urination, Urgency, Urinary frequency, Urinary Retention, Urinating at Night and Weak urinary stream. Musculoskeletal Present- Back Pain, Joint Pain, Morning Stiffness and Muscle Pain. Not Present- Joint Swelling, Muscle Weakness and Spasms. Neurological Not Present- Blackout spells, Difficulty with balance, Dizziness, Paralysis, Tremor and Weakness. Psychiatric Not Present- Insomnia.  Vitals Weight: 177 lb Height: 67in Body Surface Area: 1.92 m Body Mass Index: 27.72 kg/m  Pulse: 84 (Regular)  BP: 132/84 (Sitting, Left Arm, Standard)   Physical Exam  General Mental Status -Alert, cooperative and good historian. General Appearance-pleasant, Not in acute distress. Orientation-Oriented X3. Build & Nutrition-Well nourished and Well developed.  Head and Neck Head-normocephalic, atraumatic . Neck Global Assessment - supple, no bruit auscultated on the right, no bruit auscultated on the left.  Eye Vision-Wears corrective lenses. Pupil - Bilateral-Regular and Round. Motion - Bilateral-EOMI.  Chest and Lung Exam Auscultation Breath sounds - clear at anterior chest wall and clear at posterior chest wall. Adventitious sounds - No Adventitious  sounds.  Cardiovascular Auscultation Rhythm - Regular rate and rhythm. Heart Sounds - S1 WNL and S2 WNL. Murmurs & Other Heart Sounds - Auscultation of the heart reveals - No Murmurs.  Abdomen Palpation/Percussion Tenderness - Abdomen is non-tender to palpation. Rigidity (guarding) - Abdomen is soft. Auscultation Auscultation of the abdomen reveals - Bowel sounds normal.  Female Genitourinary Note: Not done, not pertinent to present illness   Musculoskeletal Note: She is alert and oriented, no apparent distress. Both knees show no effusion. Right knee varus deformity, range 5 to 125, moderate crepitus on range of motion. Tender medial greater than lateral. No instability. Left knee, no deformity, range 5 to 130. Slight tenderness on range of motion. She is tender medial greater than lateral, but no instability.    Assessment & Plan Primary osteoarthritis of left knee (M17.12) Primary osteoarthritis of right knee (M17.11)  Note:Surgical Plans: Right Total Knee Replacement  Disposition: Home  PCP: Dr. John Giovanni - Patient has been seen preoperatively and felt to be stable for surgery.  IV TXA  Anesthesia Issues: Nausea and Vomiting folllowing Anesthesia  Patient was instructed on what medications to stop prior to surgery.  Signed electronically by Beckey Rutter, III PA-C

## 2017-01-13 ENCOUNTER — Encounter (HOSPITAL_COMMUNITY): Payer: Self-pay | Admitting: *Deleted

## 2017-01-13 ENCOUNTER — Encounter (HOSPITAL_COMMUNITY)
Admission: RE | Disposition: A | Discharge status: Home Health | Payer: Self-pay | Source: Ambulatory Visit | Attending: Orthopedic Surgery

## 2017-01-13 ENCOUNTER — Inpatient Hospital Stay (HOSPITAL_COMMUNITY): Payer: BLUE CROSS/BLUE SHIELD | Admitting: Certified Registered"

## 2017-01-13 ENCOUNTER — Inpatient Hospital Stay (HOSPITAL_COMMUNITY)
Admission: RE | Admit: 2017-01-13 | Discharge: 2017-01-15 | DRG: 470 | Disposition: A | Discharge status: Home Health | Payer: BLUE CROSS/BLUE SHIELD | Source: Ambulatory Visit | Attending: Orthopedic Surgery | Admitting: Orthopedic Surgery

## 2017-01-13 DIAGNOSIS — Z7984 Long term (current) use of oral hypoglycemic drugs: Secondary | ICD-10-CM

## 2017-01-13 DIAGNOSIS — M069 Rheumatoid arthritis, unspecified: Secondary | ICD-10-CM | POA: Diagnosis present

## 2017-01-13 DIAGNOSIS — Z87891 Personal history of nicotine dependence: Secondary | ICD-10-CM

## 2017-01-13 DIAGNOSIS — G8929 Other chronic pain: Secondary | ICD-10-CM | POA: Diagnosis present

## 2017-01-13 DIAGNOSIS — E119 Type 2 diabetes mellitus without complications: Secondary | ICD-10-CM | POA: Diagnosis present

## 2017-01-13 DIAGNOSIS — Z86711 Personal history of pulmonary embolism: Secondary | ICD-10-CM

## 2017-01-13 DIAGNOSIS — M170 Bilateral primary osteoarthritis of knee: Secondary | ICD-10-CM | POA: Diagnosis present

## 2017-01-13 DIAGNOSIS — Z886 Allergy status to analgesic agent status: Secondary | ICD-10-CM | POA: Diagnosis not present

## 2017-01-13 DIAGNOSIS — M179 Osteoarthritis of knee, unspecified: Secondary | ICD-10-CM

## 2017-01-13 DIAGNOSIS — Z833 Family history of diabetes mellitus: Secondary | ICD-10-CM

## 2017-01-13 DIAGNOSIS — M1710 Unilateral primary osteoarthritis, unspecified knee: Secondary | ICD-10-CM | POA: Diagnosis present

## 2017-01-13 DIAGNOSIS — H04123 Dry eye syndrome of bilateral lacrimal glands: Secondary | ICD-10-CM | POA: Diagnosis present

## 2017-01-13 HISTORY — PX: TOTAL KNEE ARTHROPLASTY: SHX125

## 2017-01-13 LAB — GLUCOSE, CAPILLARY
GLUCOSE-CAPILLARY: 149 mg/dL — AB (ref 65–99)
GLUCOSE-CAPILLARY: 167 mg/dL — AB (ref 65–99)
GLUCOSE-CAPILLARY: 229 mg/dL — AB (ref 65–99)
GLUCOSE-CAPILLARY: 141 mg/dL — AB (ref 65–99)
Glucose-Capillary: 164 mg/dL — ABNORMAL HIGH (ref 65–99)

## 2017-01-13 LAB — TYPE AND SCREEN
ABO/RH(D): A POS
Antibody Screen: NEGATIVE

## 2017-01-13 SURGERY — ARTHROPLASTY, KNEE, TOTAL
Anesthesia: Spinal | Site: Knee | Laterality: Right

## 2017-01-13 MED ORDER — METHOCARBAMOL 500 MG PO TABS
500.0000 mg | ORAL_TABLET | Freq: Four times a day (QID) | ORAL | Status: DC | PRN
  Administered 2017-01-13 – 2017-01-15 (×5): 500 mg via ORAL
  Filled 2017-01-13 (×6): qty 1

## 2017-01-13 MED ORDER — TRANEXAMIC ACID 1000 MG/10ML IV SOLN
1000.0000 mg | Freq: Once | INTRAVENOUS | Status: AC
  Administered 2017-01-13: 13:00:00 1000 mg via INTRAVENOUS
  Filled 2017-01-13: qty 10

## 2017-01-13 MED ORDER — ALBUTEROL SULFATE (2.5 MG/3ML) 0.083% IN NEBU: 2.5000 mg | INHALATION_SOLUTION | Freq: Four times a day (QID) | RESPIRATORY_TRACT | Status: DC | PRN

## 2017-01-13 MED ORDER — FLEET ENEMA 7-19 GM/118ML RE ENEM: 1.0000 | ENEMA | Freq: Once | RECTAL | Status: DC | PRN

## 2017-01-13 MED ORDER — ACETAMINOPHEN 325 MG PO TABS
650.0000 mg | ORAL_TABLET | Freq: Four times a day (QID) | ORAL | Status: DC | PRN
  Administered 2017-01-15: 15:00:00 650 mg via ORAL
  Filled 2017-01-13: qty 2

## 2017-01-13 MED ORDER — POLYETHYLENE GLYCOL 3350 17 G PO PACK
17.0000 g | PACK | Freq: Every day | ORAL | Status: DC | PRN
  Administered 2017-01-13: 18:00:00 17 g via ORAL
  Filled 2017-01-13: qty 1

## 2017-01-13 MED ORDER — SUCCINYLCHOLINE CHLORIDE 200 MG/10ML IV SOSY
PREFILLED_SYRINGE | INTRAVENOUS | Status: AC
  Filled 2017-01-13: qty 10

## 2017-01-13 MED ORDER — MENTHOL 3 MG MT LOZG: 1.0000 | LOZENGE | OROMUCOSAL | Status: DC | PRN

## 2017-01-13 MED ORDER — ACETAMINOPHEN 10 MG/ML IV SOLN
1000.0000 mg | Freq: Once | INTRAVENOUS | Status: AC
  Administered 2017-01-13: 1000 mg via INTRAVENOUS
  Filled 2017-01-13: qty 100

## 2017-01-13 MED ORDER — OXYCODONE HCL 5 MG PO TABS
5.0000 mg | ORAL_TABLET | ORAL | Status: DC | PRN
  Administered 2017-01-13 – 2017-01-15 (×13): 10 mg via ORAL
  Filled 2017-01-13 (×15): qty 2

## 2017-01-13 MED ORDER — MORPHINE SULFATE (PF) 2 MG/ML IV SOLN
1.0000 mg | INTRAVENOUS | Status: DC | PRN
  Administered 2017-01-13 (×2): 1 mg via INTRAVENOUS
  Filled 2017-01-13 (×2): qty 1

## 2017-01-13 MED ORDER — SODIUM CHLORIDE 0.9 % IJ SOLN
INTRAMUSCULAR | Status: DC | PRN
  Administered 2017-01-13: 30 mL

## 2017-01-13 MED ORDER — ACETAMINOPHEN 10 MG/ML IV SOLN
INTRAVENOUS | Status: AC
  Filled 2017-01-13: qty 100

## 2017-01-13 MED ORDER — EPHEDRINE 5 MG/ML INJ
INTRAVENOUS | Status: AC
  Filled 2017-01-13: qty 10

## 2017-01-13 MED ORDER — ESCITALOPRAM OXALATE 10 MG PO TABS
10.0000 mg | ORAL_TABLET | Freq: Every day | ORAL | Status: DC
  Administered 2017-01-13 – 2017-01-14 (×2): 10 mg via ORAL
  Filled 2017-01-13 (×2): qty 1

## 2017-01-13 MED ORDER — ROPIVACAINE HCL 7.5 MG/ML IJ SOLN
INTRAMUSCULAR | Status: AC
  Filled 2017-01-13: qty 20

## 2017-01-13 MED ORDER — TRANEXAMIC ACID 1000 MG/10ML IV SOLN
1000.0000 mg | INTRAVENOUS | Status: AC
  Administered 2017-01-13: 1000 mg via INTRAVENOUS
  Filled 2017-01-13: qty 10

## 2017-01-13 MED ORDER — MIDAZOLAM HCL 2 MG/2ML IJ SOLN
INTRAMUSCULAR | Status: DC | PRN
  Administered 2017-01-13: 2 mg via INTRAVENOUS

## 2017-01-13 MED ORDER — PHENOL 1.4 % MT LIQD: 1.0000 | OROMUCOSAL | Status: DC | PRN

## 2017-01-13 MED ORDER — ROPIVACAINE HCL 7.5 MG/ML IJ SOLN
INTRAMUSCULAR | Status: DC | PRN
  Administered 2017-01-13: 20 mL via PERINEURAL

## 2017-01-13 MED ORDER — ONDANSETRON HCL 4 MG/2ML IJ SOLN: 4.0000 mg | Freq: Four times a day (QID) | INTRAMUSCULAR | Status: DC | PRN

## 2017-01-13 MED ORDER — BUPIVACAINE IN DEXTROSE 0.75-8.25 % IT SOLN
INTRATHECAL | Status: DC | PRN
  Administered 2017-01-13: 1.8 mL via INTRATHECAL

## 2017-01-13 MED ORDER — ACETAMINOPHEN 650 MG RE SUPP: 650.0000 mg | Freq: Four times a day (QID) | RECTAL | Status: DC | PRN

## 2017-01-13 MED ORDER — CHLORHEXIDINE GLUCONATE 4 % EX LIQD: 60.0000 mL | Freq: Once | CUTANEOUS | Status: DC

## 2017-01-13 MED ORDER — LIDOCAINE 2% (20 MG/ML) 5 ML SYRINGE
INTRAMUSCULAR | Status: AC
  Filled 2017-01-13: qty 5

## 2017-01-13 MED ORDER — ONDANSETRON HCL 4 MG/2ML IJ SOLN
INTRAMUSCULAR | Status: AC
  Filled 2017-01-13: qty 2

## 2017-01-13 MED ORDER — METHOCARBAMOL 1000 MG/10ML IJ SOLN
500.0000 mg | Freq: Four times a day (QID) | INTRAMUSCULAR | Status: DC | PRN
  Administered 2017-01-13: 500 mg via INTRAVENOUS
  Filled 2017-01-13: qty 550
  Filled 2017-01-13: qty 5

## 2017-01-13 MED ORDER — OXYCODONE HCL 5 MG/5ML PO SOLN: 5.0000 mg | Freq: Once | ORAL | Status: DC | PRN

## 2017-01-13 MED ORDER — CYCLOSPORINE 0.05 % OP EMUL
1.0000 [drp] | Freq: Two times a day (BID) | OPHTHALMIC | Status: DC
  Administered 2017-01-13 – 2017-01-15 (×4): 1 [drp] via OPHTHALMIC
  Filled 2017-01-13 (×5): qty 1

## 2017-01-13 MED ORDER — BISACODYL 10 MG RE SUPP: 10.0000 mg | Freq: Every day | RECTAL | Status: DC | PRN

## 2017-01-13 MED ORDER — ONDANSETRON HCL 4 MG/2ML IJ SOLN
4.0000 mg | Freq: Four times a day (QID) | INTRAMUSCULAR | Status: DC | PRN
  Administered 2017-01-13: 12:00:00 4 mg via INTRAVENOUS
  Filled 2017-01-13: qty 2

## 2017-01-13 MED ORDER — HYDROMORPHONE HCL 1 MG/ML IJ SOLN
1.0000 mg | INTRAMUSCULAR | Status: DC | PRN
  Administered 2017-01-13 (×3): 1 mg via INTRAVENOUS
  Filled 2017-01-13 (×3): qty 1

## 2017-01-13 MED ORDER — FENTANYL CITRATE (PF) 100 MCG/2ML IJ SOLN: 25.0000 ug | INTRAMUSCULAR | Status: DC | PRN

## 2017-01-13 MED ORDER — METOCLOPRAMIDE HCL 5 MG PO TABS: 5.0000 mg | ORAL_TABLET | Freq: Three times a day (TID) | ORAL | Status: DC | PRN

## 2017-01-13 MED ORDER — DEXAMETHASONE SODIUM PHOSPHATE 10 MG/ML IJ SOLN
INTRAMUSCULAR | Status: AC
  Filled 2017-01-13: qty 1

## 2017-01-13 MED ORDER — ONDANSETRON HCL 4 MG PO TABS
4.0000 mg | ORAL_TABLET | Freq: Four times a day (QID) | ORAL | Status: DC | PRN
  Administered 2017-01-15: 12:00:00 4 mg via ORAL
  Filled 2017-01-13: qty 1

## 2017-01-13 MED ORDER — SODIUM CHLORIDE 0.9 % IJ SOLN
INTRAMUSCULAR | Status: AC
  Filled 2017-01-13: qty 50

## 2017-01-13 MED ORDER — CEFAZOLIN SODIUM-DEXTROSE 2-4 GM/100ML-% IV SOLN
INTRAVENOUS | Status: AC
  Filled 2017-01-13: qty 100

## 2017-01-13 MED ORDER — 0.9 % SODIUM CHLORIDE (POUR BTL) OPTIME
TOPICAL | Status: DC | PRN
  Administered 2017-01-13: 1000 mL

## 2017-01-13 MED ORDER — FLUTICASONE PROPIONATE 50 MCG/ACT NA SUSP
1.0000 | Freq: Every day | NASAL | Status: DC | PRN
  Filled 2017-01-13: qty 16

## 2017-01-13 MED ORDER — ROCURONIUM BROMIDE 50 MG/5ML IV SOSY
PREFILLED_SYRINGE | INTRAVENOUS | Status: AC
  Filled 2017-01-13: qty 5

## 2017-01-13 MED ORDER — BUPIVACAINE LIPOSOME 1.3 % IJ SUSP
20.0000 mL | Freq: Once | INTRAMUSCULAR | Status: DC
  Filled 2017-01-13: qty 20

## 2017-01-13 MED ORDER — PROPOFOL 500 MG/50ML IV EMUL: INTRAVENOUS | Status: DC | PRN

## 2017-01-13 MED ORDER — LACTATED RINGERS IV SOLN
INTRAVENOUS | Status: DC
  Administered 2017-01-13 (×2): via INTRAVENOUS

## 2017-01-13 MED ORDER — METOCLOPRAMIDE HCL 5 MG/ML IJ SOLN: 5.0000 mg | Freq: Three times a day (TID) | INTRAMUSCULAR | Status: DC | PRN

## 2017-01-13 MED ORDER — FENTANYL CITRATE (PF) 100 MCG/2ML IJ SOLN
INTRAMUSCULAR | Status: AC
  Filled 2017-01-13: qty 2

## 2017-01-13 MED ORDER — MIDAZOLAM HCL 2 MG/2ML IJ SOLN
INTRAMUSCULAR | Status: AC
  Filled 2017-01-13: qty 2

## 2017-01-13 MED ORDER — DEXAMETHASONE SODIUM PHOSPHATE 10 MG/ML IJ SOLN
10.0000 mg | Freq: Once | INTRAMUSCULAR | Status: AC
  Administered 2017-01-13: 10 mg via INTRAVENOUS

## 2017-01-13 MED ORDER — METOCLOPRAMIDE HCL 5 MG/ML IJ SOLN
INTRAMUSCULAR | Status: AC
  Filled 2017-01-13: qty 2

## 2017-01-13 MED ORDER — OXYCODONE HCL 5 MG PO TABS: 5.0000 mg | ORAL_TABLET | Freq: Once | ORAL | Status: DC | PRN

## 2017-01-13 MED ORDER — CEFAZOLIN SODIUM-DEXTROSE 2-4 GM/100ML-% IV SOLN
2.0000 g | INTRAVENOUS | Status: AC
  Administered 2017-01-13: 2 g via INTRAVENOUS
  Filled 2017-01-13: qty 100

## 2017-01-13 MED ORDER — INSULIN ASPART 100 UNIT/ML ~~LOC~~ SOLN
0.0000 [IU] | Freq: Three times a day (TID) | SUBCUTANEOUS | Status: DC
  Administered 2017-01-13: 18:00:00 5 [IU] via SUBCUTANEOUS
  Administered 2017-01-13: 13:00:00 3 [IU] via SUBCUTANEOUS
  Administered 2017-01-14: 2 [IU] via SUBCUTANEOUS
  Administered 2017-01-14 (×2): 5 [IU] via SUBCUTANEOUS
  Administered 2017-01-15: 3 [IU] via SUBCUTANEOUS

## 2017-01-13 MED ORDER — STERILE WATER FOR IRRIGATION IR SOLN
Status: DC | PRN
  Administered 2017-01-13: 2000 mL

## 2017-01-13 MED ORDER — PHENYLEPHRINE HCL 10 MG/ML IJ SOLN
INTRAMUSCULAR | Status: DC | PRN
  Administered 2017-01-13 (×4): 40 ug via INTRAVENOUS

## 2017-01-13 MED ORDER — BUPIVACAINE LIPOSOME 1.3 % IJ SUSP
INTRAMUSCULAR | Status: DC | PRN
  Administered 2017-01-13: 20 mL

## 2017-01-13 MED ORDER — PREGABALIN 75 MG PO CAPS
75.0000 mg | ORAL_CAPSULE | Freq: Three times a day (TID) | ORAL | Status: DC
  Administered 2017-01-13 – 2017-01-15 (×7): 75 mg via ORAL
  Filled 2017-01-13 (×7): qty 1

## 2017-01-13 MED ORDER — EPHEDRINE SULFATE 50 MG/ML IJ SOLN
INTRAMUSCULAR | Status: DC | PRN
  Administered 2017-01-13 (×2): 10 mg via INTRAVENOUS

## 2017-01-13 MED ORDER — RIVAROXABAN 10 MG PO TABS
10.0000 mg | ORAL_TABLET | Freq: Every day | ORAL | Status: DC
  Administered 2017-01-14 – 2017-01-15 (×2): 10 mg via ORAL
  Filled 2017-01-13 (×2): qty 1

## 2017-01-13 MED ORDER — BUPIVACAINE HCL (PF) 0.25 % IJ SOLN
INTRAMUSCULAR | Status: AC
  Filled 2017-01-13: qty 30

## 2017-01-13 MED ORDER — PROPOFOL 10 MG/ML IV BOLUS
INTRAVENOUS | Status: AC
  Filled 2017-01-13: qty 80

## 2017-01-13 MED ORDER — SODIUM CHLORIDE 0.9 % IV SOLN
INTRAVENOUS | Status: DC
  Administered 2017-01-13: 14:00:00 via INTRAVENOUS

## 2017-01-13 MED ORDER — ZOLPIDEM TARTRATE 5 MG PO TABS
5.0000 mg | ORAL_TABLET | Freq: Every evening | ORAL | Status: DC | PRN
Start: 2017-01-13 — End: 2017-01-15
  Administered 2017-01-13: 21:00:00 5 mg via ORAL
  Filled 2017-01-13: qty 1

## 2017-01-13 MED ORDER — CEFAZOLIN SODIUM-DEXTROSE 2-4 GM/100ML-% IV SOLN
2.0000 g | Freq: Four times a day (QID) | INTRAVENOUS | Status: AC
  Administered 2017-01-13 (×2): 2 g via INTRAVENOUS
  Filled 2017-01-13 (×2): qty 100

## 2017-01-13 MED ORDER — ACETAMINOPHEN 500 MG PO TABS
1000.0000 mg | ORAL_TABLET | Freq: Four times a day (QID) | ORAL | Status: AC
  Administered 2017-01-13 – 2017-01-14 (×4): 1000 mg via ORAL
  Filled 2017-01-13 (×4): qty 2

## 2017-01-13 MED ORDER — DIPHENHYDRAMINE HCL 12.5 MG/5ML PO ELIX
12.5000 mg | ORAL_SOLUTION | ORAL | Status: DC | PRN
  Administered 2017-01-14: 17:00:00 25 mg via ORAL
  Filled 2017-01-13: qty 10

## 2017-01-13 MED ORDER — DEXAMETHASONE SODIUM PHOSPHATE 10 MG/ML IJ SOLN
10.0000 mg | Freq: Once | INTRAMUSCULAR | Status: AC
  Administered 2017-01-14: 08:00:00 10 mg via INTRAVENOUS
  Filled 2017-01-13: qty 1

## 2017-01-13 MED ORDER — PHENYLEPHRINE 40 MCG/ML (10ML) SYRINGE FOR IV PUSH (FOR BLOOD PRESSURE SUPPORT)
PREFILLED_SYRINGE | INTRAVENOUS | Status: AC
  Filled 2017-01-13: qty 10

## 2017-01-13 MED ORDER — DOCUSATE SODIUM 100 MG PO CAPS
100.0000 mg | ORAL_CAPSULE | Freq: Two times a day (BID) | ORAL | Status: DC
Start: 2017-01-13 — End: 2017-01-15
  Administered 2017-01-13 – 2017-01-15 (×4): 100 mg via ORAL
  Filled 2017-01-13 (×4): qty 1

## 2017-01-13 MED ORDER — PROPOFOL 500 MG/50ML IV EMUL
INTRAVENOUS | Status: DC | PRN
  Administered 2017-01-13: 75 ug/kg/min via INTRAVENOUS

## 2017-01-13 SURGICAL SUPPLY — 49 items
BAG DECANTER FOR FLEXI CONT (MISCELLANEOUS) ×3 IMPLANT
BAG SPEC THK2 15X12 ZIP CLS (MISCELLANEOUS) ×1
BAG ZIPLOCK 12X15 (MISCELLANEOUS) ×3 IMPLANT
BANDAGE ACE 6X5 VEL STRL LF (GAUZE/BANDAGES/DRESSINGS) ×3 IMPLANT
BLADE SAG 18X100X1.27 (BLADE) ×3 IMPLANT
BLADE SAW SGTL 11.0X1.19X90.0M (BLADE) ×3 IMPLANT
BOWL SMART MIX CTS (DISPOSABLE) ×3 IMPLANT
CAPT KNEE TOTAL 3 ATTUNE ×2 IMPLANT
CEMENT HV SMART SET (Cement) ×6 IMPLANT
CLOSURE WOUND 1/2 X4 (GAUZE/BANDAGES/DRESSINGS) ×1
CLOTH BEACON ORANGE TIMEOUT ST (SAFETY) ×3 IMPLANT
CUFF TOURN SGL QUICK 34 (TOURNIQUET CUFF) ×3
CUFF TRNQT CYL 34X4X40X1 (TOURNIQUET CUFF) ×1 IMPLANT
DECANTER SPIKE VIAL GLASS SM (MISCELLANEOUS) ×3 IMPLANT
DRAPE U-SHAPE 47X51 STRL (DRAPES) ×3 IMPLANT
DRSG ADAPTIC 3X8 NADH LF (GAUZE/BANDAGES/DRESSINGS) ×3 IMPLANT
DRSG PAD ABDOMINAL 8X10 ST (GAUZE/BANDAGES/DRESSINGS) ×3 IMPLANT
DURAPREP 26ML APPLICATOR (WOUND CARE) ×3 IMPLANT
ELECT REM PT RETURN 9FT ADLT (ELECTROSURGICAL) ×3
ELECTRODE REM PT RTRN 9FT ADLT (ELECTROSURGICAL) ×1 IMPLANT
EVACUATOR 1/8 PVC DRAIN (DRAIN) ×3 IMPLANT
GAUZE SPONGE 4X4 12PLY STRL (GAUZE/BANDAGES/DRESSINGS) ×3 IMPLANT
GLOVE BIO SURGEON STRL SZ7.5 (GLOVE) ×4 IMPLANT
GLOVE BIO SURGEON STRL SZ8 (GLOVE) ×3 IMPLANT
GLOVE BIOGEL PI IND STRL 7.5 (GLOVE) IMPLANT
GLOVE BIOGEL PI IND STRL 8 (GLOVE) ×1 IMPLANT
GLOVE BIOGEL PI INDICATOR 7.5 (GLOVE) ×8
GLOVE BIOGEL PI INDICATOR 8 (GLOVE) ×4
GLOVE SURG SS PI 7.0 STRL IVOR (GLOVE) ×2 IMPLANT
GOWN STRL REUS W/TWL LRG LVL3 (GOWN DISPOSABLE) ×3 IMPLANT
GOWN STRL REUS W/TWL XL LVL3 (GOWN DISPOSABLE) ×6 IMPLANT
HANDPIECE INTERPULSE COAX TIP (DISPOSABLE) ×3
IMMOBILIZER KNEE 20 (SOFTGOODS) ×3
IMMOBILIZER KNEE 20 THIGH 36 (SOFTGOODS) ×1 IMPLANT
MANIFOLD NEPTUNE II (INSTRUMENTS) ×3 IMPLANT
PACK TOTAL KNEE CUSTOM (KITS) ×3 IMPLANT
PAD ABD 8X10 STRL (GAUZE/BANDAGES/DRESSINGS) ×2 IMPLANT
PADDING CAST COTTON 6X4 STRL (CAST SUPPLIES) ×5 IMPLANT
POSITIONER SURGICAL ARM (MISCELLANEOUS) ×3 IMPLANT
SET HNDPC FAN SPRY TIP SCT (DISPOSABLE) ×1 IMPLANT
STRIP CLOSURE SKIN 1/2X4 (GAUZE/BANDAGES/DRESSINGS) ×3 IMPLANT
SUT MNCRL AB 4-0 PS2 18 (SUTURE) ×3 IMPLANT
SUT VIC AB 2-0 CT1 27 (SUTURE) ×9
SUT VIC AB 2-0 CT1 TAPERPNT 27 (SUTURE) ×3 IMPLANT
SUT VLOC 180 0 24IN GS25 (SUTURE) ×3 IMPLANT
SYR 50ML LL SCALE MARK (SYRINGE) ×3 IMPLANT
TRAY FOLEY CATH SILVER 14FR (SET/KITS/TRAYS/PACK) ×2 IMPLANT
WRAP KNEE MAXI GEL POST OP (GAUZE/BANDAGES/DRESSINGS) ×3 IMPLANT
YANKAUER SUCT BULB TIP 10FT TU (MISCELLANEOUS) ×3 IMPLANT

## 2017-01-13 NOTE — Transfer of Care (Signed)
Immediate Anesthesia Transfer of Care Note  Patient: [name]  Procedure(s) Performed: Procedure(s) with comments: RIGHT TOTAL KNEE ARTHROPLASTY (Right) - Adductor Block  Patient Location: PACU  Anesthesia Type:Spinal  Level of Consciousness: alert , oriented and patient cooperative  Airway & Oxygen Therapy: Patient connected to face mask oxygen  Post-op Assessment: Post -op Vital signs reviewed and stable  Post vital signs: stable  Last Vitals:  Vitals:   01/13/17 0613  BP: 120/77  Pulse: 68  Resp: 16  Temp: 36.7 C    Last Pain:  Vitals:   01/13/17 0621  TempSrc:   PainSc: 5       Patients Stated Pain Goal: 4 (01/13/17 4098)  Complications: No apparent anesthesia complications

## 2017-01-13 NOTE — Op Note (Signed)
OPERATIVE REPORT-TOTAL KNEE ARTHROPLASTY   Pre-operative diagnosis- Osteoarthritis  Right knee(s)  Post-operative diagnosis- Osteoarthritis Right knee(s)  Procedure-  Right  Total Knee Arthroplasty (Depuy Attune)  Surgeon- Gus Rankin. Aksel Bencomo, MD  Assistant- Avel Peace, PA-C   Anesthesia-  Adductor canal block and spinal  EBL-* No blood loss amount entered *   Drains Hemovac  Tourniquet time-  Total Tourniquet Time Documented: Thigh (Right) - 31 minutes Total: Thigh (Right) - 31 minutes     Complications- None  Condition-PACU - hemodynamically stable.   Brief Clinical Note  [name] is a 61 y.o. year old female with end stage OA of her right knee with progressively worsening pain and dysfunction. She has constant pain, with activity and at rest and significant functional deficits with difficulties even with ADLs. She has had extensive non-op management including analgesics, injections of cortisone and viscosupplements, and home exercise program, but remains in significant pain with significant dysfunction.Radiographs show bone on bone arthritis medial and patellofemoral. She presents now for right Total Knee Arthroplasty.    Procedure in detail---   The patient is brought into the operating room and positioned supine on the operating table. After successful administration of  Adductor canal block and spinal,   a tourniquet is placed high on the  Right thigh(s) and the lower extremity is prepped and draped in the usual sterile fashion. Time out is performed by the operating team and then the  Right lower extremity is wrapped in Esmarch, knee flexed and the tourniquet inflated to 300 mmHg.       A midline incision is made with a ten blade through the subcutaneous tissue to the level of the extensor mechanism. A fresh blade is used to make a medial parapatellar arthrotomy. Soft tissue over the proximal medial tibia is subperiosteally elevated to the joint line with a knife and  into the semimembranosus bursa with a Cobb elevator. Soft tissue over the proximal lateral tibia is elevated with attention being paid to avoiding the patellar tendon on the tibial tubercle. The patella is everted, knee flexed 90 degrees and the ACL and PCL are removed. Findings are bone on bone medial and patellofemoral with large global osteophytes.        The drill is used to create a starting hole in the distal femur and the canal is thoroughly irrigated with sterile saline to remove the fatty contents. The 5 degree Right  valgus alignment guide is placed into the femoral canal and the distal femoral cutting block is pinned to remove 9 mm off the distal femur. Resection is made with an oscillating saw.      The tibia is subluxed forward and the menisci are removed. The extramedullary alignment guide is placed referencing proximally at the medial aspect of the tibial tubercle and distally along the second metatarsal axis and tibial crest. The block is pinned to remove 49mm off the more deficient medial  side. Resection is made with an oscillating saw. Size 4is the most appropriate size for the tibia and the proximal tibia is prepared with the modular drill and keel punch for that size.      The femoral sizing guide is placed and size 5 is most appropriate. Rotation is marked off the epicondylar axis and confirmed by creating a rectangular flexion gap at 90 degrees. The size 5 cutting block is pinned in this rotation and the anterior, posterior and chamfer cuts are made with the oscillating saw. The intercondylar block is then placed  and that cut is made.      Trial size 4 tibial component, trial size 5 posterior stabilized femur and a 6  mm posterior stabilized rotating platform insert trial is placed. Full extension is achieved with excellent varus/valgus and anterior/posterior balance throughout full range of motion. The patella is everted and thickness measured to be 22  mm. Free hand resection is taken to  12 mm, a 35 template is placed, lug holes are drilled, trial patella is placed, and it tracks normally. Osteophytes are removed off the posterior femur with the trial in place. All trials are removed and the cut bone surfaces prepared with pulsatile lavage. Cement is mixed and once ready for implantation, the size 4 tibial implant, size  5 posterior stabilized femoral component, and the size 35 patella are cemented in place and the patella is held with the clamp. The trial insert is placed and the knee held in full extension. The Exparel (20 ml mixed with 30 ml saline) is injected into the extensor mechanism, posterior capsule, medial and lateral gutters and subcutaneous tissues.  All extruded cement is removed and once the cement is hard the permanent 6 mm posterior stabilized rotating platform insert is placed into the tibial tray.      The wound is copiously irrigated with saline solution and the extensor mechanism closed over a hemovac drain with #1 V-loc suture. The tourniquet is released for a total tourniquet time of 31  minutes. Flexion against gravity is 140 degrees and the patella tracks normally. Subcutaneous tissue is closed with 2.0 vicryl and subcuticular with running 4.0 Monocryl. The incision is cleaned and dried and steri-strips and a bulky sterile dressing are applied. The limb is placed into a knee immobilizer and the patient is awakened and transported to recovery in stable condition.      Please note that a surgical assistant was a medical necessity for this procedure in order to perform it in a safe and expeditious manner. Surgical assistant was necessary to retract the ligaments and vital neurovascular structures to prevent injury to them and also necessary for proper positioning of the limb to allow for anatomic placement of the prosthesis.   Gus Rankin Diana Talamante, MD    01/13/2017, 9:11 AM

## 2017-01-13 NOTE — Anesthesia Postprocedure Evaluation (Addendum)
Anesthesia Post Note  Patient: [name]  Procedure(s) Performed: Procedure(s) (LRB): RIGHT TOTAL KNEE ARTHROPLASTY (Right)  Patient location during evaluation: PACU Anesthesia Type: Spinal Level of consciousness: oriented and awake and alert Pain management: pain level controlled Vital Signs Assessment: post-procedure vital signs reviewed and stable Respiratory status: spontaneous breathing, respiratory function stable and patient connected to nasal cannula oxygen Cardiovascular status: blood pressure returned to baseline and stable Postop Assessment: no headache and no backache Anesthetic complications: no       Last Vitals:  Vitals:   01/13/17 1000 01/13/17 1015  BP: 118/61 (!) 117/57  Pulse: (!) 56 (!) 56  Resp: 14 11  Temp:      Last Pain:  Vitals:   01/13/17 0621  TempSrc:   PainSc: 5                  Madine Sarr S

## 2017-01-13 NOTE — H&P (View-Only) (Signed)
[name] DOB: 1956/10/05 Married / Language: English / Race: White Female Date of Admission: 01/13/2017 CC:  Right Knee Pain History of Present Illness The patient is a 61 year old female who comes in for a preoperative History and Physical. The patient is scheduled for a right total knee arthroplasty to be performed by Dr. Gus Rankin. Aluisio, MD at [facility] on 01-13-2017. The patient is a 61 year old female who presented for follow up of their knee. The patient is being followed for their bilateral knee pain and osteoarthritis. They are now month(s) out from Euflexxa series. Symptoms reported include: pain, swelling, stiffness, giving way and difficulty arising from chair. The patient feels that they are doing poorly and report their pain level to be mild to moderate. The following medication has been used for pain control: Hydrocodone. The patient has reported improvement of their symptoms with: viscosupplementation (relief for about 8 weeks). Pt. has been falling lately due to knee's Unfortunately, both knees are getting progressively worse. She said they hurt equally. She has had cortisone and viscosupplement injections would any kind of long lasting benefit. She is at a stage now she wants to be able to do more and her knees are preventing her from doing so. She is ready toget the knees fixed. She will proceed with the right knee. They have been treated conservatively in the past for the above stated problem and despite conservative measures, they continue to have progressive pain and severe functional limitations and dysfunction. They have failed non-operative management including home exercise, medications, and injections. It is felt that they would benefit from undergoing total joint replacement. Risks and benefits of the procedure have been discussed with the patient and they elect to proceed with surgery. There are no active contraindications to surgery such as ongoing infection or  rapidly progressive neurological disease.  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)  Primary osteoarthritis of both knees (M17.0)  Rheumatoid Arthritis  Diabetes Mellitus, Type II  Chronic Pain  Asthma  Cataract  Menopause  Scarlet Fever  Pulmonary Embolism  Blood Clot  Chronic Dry Eyes  Allergies No Known Drug Allergies   Family History Cancer  mother Cerebrovascular Accident  grandmother fathers side Diabetes Mellitus  father and grandmother fathers side Heart Disease  grandfather fathers side Hypertension  mother Liver Disease, Chronic  brother  Social History Alcohol use  never consumed alcohol Children  0 Current work status  working full time Drug/Alcohol Rehab (Currently)  no Drug/Alcohol Rehab (Previously)  no Exercise  Exercises daily; does other Illicit drug use  no Living situation  live with spouse Marital status  married Number of flights of stairs before winded  2-3 Tobacco / smoke exposure  yes Tobacco use  former smoker; smoke(d) less than 1/2 pack(s) per day Advance Directives  Living Will  Medication History Fluticasone Propionate (50MCG/ACT Suspension, Nasal) Active. Restasis (0.05% Emulsion, Ophthalmic) Active. Hydrocodone-Acetaminophen (10-325MG  Tablet, Oral) Active. Lyrica (75MG  Capsule, Oral) Active. Lovastatin (20MG  Tablet, Oral) Active. MetFORMIN HCl ER (OSM) (1000MG  Tablet ER 24HR, Oral) Active. Hydroxychloroquine Sulfate (200MG  Tablet, Oral) Active. Escitalopram Oxalate (20MG  Tablet, 1/2 Oral daily) Active. Lunesta (3MG  Tablet, Oral) Active. Prolensa (0.07% Solution, Ophthalmic) Active.  Past Surgical History  Breast Biopsy  left Carpal Tunnel Repair  bilateral; 1990's Cataract Surgery  Date: 2013. right Hysterectomy  Date: 61. partial (non-cancerous)     Review of Systems  General Present- Fatigue. Not Present- Chills, Fever, Memory Loss,  Night  Sweats, Weight Gain and Weight Loss. Skin Not Present- Eczema, Hives, Itching, Lesions and Rash. HEENT Not Present- Dentures, Double Vision, Headache, Hearing Loss, Tinnitus and Visual Loss. Respiratory Not Present- Allergies, Chronic Cough, Coughing up blood, Shortness of breath at rest and Shortness of breath with exertion. Cardiovascular Not Present- Chest Pain, Difficulty Breathing Lying Down, Murmur, Palpitations, Racing/skipping heartbeats and Swelling. Gastrointestinal Not Present- Abdominal Pain, Bloody Stool, Constipation, Diarrhea, Difficulty Swallowing, Heartburn, Jaundice, Loss of appetitie, Nausea and Vomiting. Female Genitourinary Not Present- Blood in Urine, Discharge, Flank Pain, Incontinence, Painful Urination, Urgency, Urinary frequency, Urinary Retention, Urinating at Night and Weak urinary stream. Musculoskeletal Present- Back Pain, Joint Pain, Morning Stiffness and Muscle Pain. Not Present- Joint Swelling, Muscle Weakness and Spasms. Neurological Not Present- Blackout spells, Difficulty with balance, Dizziness, Paralysis, Tremor and Weakness. Psychiatric Not Present- Insomnia.  Vitals Weight: 177 lb Height: 67in Body Surface Area: 1.92 m Body Mass Index: 27.72 kg/m  Pulse: 84 (Regular)  BP: 132/84 (Sitting, Left Arm, Standard)   Physical Exam  General Mental Status -Alert, cooperative and good historian. General Appearance-pleasant, Not in acute distress. Orientation-Oriented X3. Build & Nutrition-Well nourished and Well developed.  Head and Neck Head-normocephalic, atraumatic . Neck Global Assessment - supple, no bruit auscultated on the right, no bruit auscultated on the left.  Eye Vision-Wears corrective lenses. Pupil - Bilateral-Regular and Round. Motion - Bilateral-EOMI.  Chest and Lung Exam Auscultation Breath sounds - clear at anterior chest wall and clear at posterior chest wall. Adventitious sounds - No Adventitious  sounds.  Cardiovascular Auscultation Rhythm - Regular rate and rhythm. Heart Sounds - S1 WNL and S2 WNL. Murmurs & Other Heart Sounds - Auscultation of the heart reveals - No Murmurs.  Abdomen Palpation/Percussion Tenderness - Abdomen is non-tender to palpation. Rigidity (guarding) - Abdomen is soft. Auscultation Auscultation of the abdomen reveals - Bowel sounds normal.  Female Genitourinary Note: Not done, not pertinent to present illness   Musculoskeletal Note: She is alert and oriented, no apparent distress. Both knees show no effusion. Right knee varus deformity, range 5 to 125, moderate crepitus on range of motion. Tender medial greater than lateral. No instability. Left knee, no deformity, range 5 to 130. Slight tenderness on range of motion. She is tender medial greater than lateral, but no instability.    Assessment & Plan Primary osteoarthritis of left knee (M17.12) Primary osteoarthritis of right knee (M17.11)  Note:Surgical Plans: Right Total Knee Replacement  Disposition: Home  PCP: Dr. Stephen Knowlton - Patient has been seen preoperatively and felt to be stable for surgery.  IV TXA  Anesthesia Issues: Nausea and Vomiting folllowing Anesthesia  Patient was instructed on what medications to stop prior to surgery.  Signed electronically by Alezandrew L Perkins, III PA-C 

## 2017-01-13 NOTE — Addendum Note (Signed)
Addendum  created 01/13/17 1058 by Donna Bernard, CRNA   Anesthesia Intra Flowsheets edited

## 2017-01-13 NOTE — Evaluation (Signed)
Physical Therapy Evaluation Patient Details Name: [name] MRN: [id number] DOB: [birth year] Today's Date: 01/13/2017   History of Present Illness  R TKA  Clinical Impression  The patient ambulated x 50'. Tolerated well. plans Dc home w/ HHPT. Pt admitted with above diagnosis. Pt currently with functional limitations due to the deficits listed below (see PT Problem List).  Pt will benefit from skilled PT to increase their independence and safety with mobility to allow discharge to the venue listed below.       Follow Up Recommendations Home health PT;Supervision/Assistance - 24 hour    Equipment Recommendations  None recommended by PT    Recommendations for Other Services       Precautions / Restrictions Precautions Precautions: Knee Required Braces or Orthoses: Knee Immobilizer - Right Knee Immobilizer - Right: Discontinue once straight leg raise with < 10 degree lag      Mobility  Bed Mobility Overal bed mobility: Needs Assistance Bed Mobility: Supine to Sit     Supine to sit: Min assist     General bed mobility comments: support the right leg  Transfers Overall transfer level: Needs assistance Equipment used: Rolling walker (2 wheeled) Transfers: Sit to/from Stand Sit to Stand: Min assist         General transfer comment: cues for hand placement and right leg position  Ambulation/Gait Ambulation/Gait assistance: Min assist Ambulation Distance (Feet): 50 Feet Assistive device: Rolling walker (2 wheeled) Gait Pattern/deviations: Step-to pattern;Antalgic     General Gait Details: cues for sequence  Stairs            Wheelchair Mobility    Modified Rankin (Stroke Patients Only)       Balance                                             Pertinent Vitals/Pain Pain Assessment: 0-10 Pain Score: 5  Pain Location: right knee Pain Descriptors / Indicators: Aching Pain Intervention(s): Repositioned;Premedicated before  session;Ice applied;Monitored during session    Home Living Family/patient expects to be discharged to:: Private residence Living Arrangements: Other relatives;Parent;Spouse/significant other Available Help at Discharge: Family Type of Home: House Home Access: Stairs to enter Entrance Stairs-Rails: None Entrance Stairs-Number of Steps: 2   Home Equipment: Environmental consultant - 2 wheels;Bedside commode      Prior Function Level of Independence: Independent               Hand Dominance        Extremity/Trunk Assessment   Upper Extremity Assessment Upper Extremity Assessment: Defer to OT evaluation    Lower Extremity Assessment Lower Extremity Assessment: RLE deficits/detail RLE Deficits / Details: able to lift the leg from bed       Communication   Communication: No difficulties  Cognition Arousal/Alertness: Awake/alert Behavior During Therapy: WFL for tasks assessed/performed Overall Cognitive Status: Within Functional Limits for tasks assessed                      General Comments      Exercises     Assessment/Plan    PT Assessment Patient needs continued PT services  PT Problem List Decreased strength;Decreased range of motion;Decreased activity tolerance;Decreased mobility;Decreased knowledge of precautions;Decreased safety awareness;Decreased knowledge of use of DME;Pain          PT Treatment Interventions DME instruction;Gait training;Stair training;Functional mobility training;Therapeutic activities;Patient/family education  PT Goals (Current goals can be found in the Care Plan section)  Acute Rehab PT Goals Patient Stated Goal: to walk, get the orther knee fixed PT Goal Formulation: With patient/family Time For Goal Achievement: 01/16/17 Potential to Achieve Goals: Good    Frequency 7X/week   Barriers to discharge        Co-evaluation               End of Session Equipment Utilized During Treatment: Right knee  immobilizer Activity Tolerance: Patient tolerated treatment well Patient left: in chair;with call bell/phone within reach;with family/visitor present Nurse Communication: Mobility status         Time: 3893-7342 PT Time Calculation (min) (ACUTE ONLY): 18 min   Charges:   PT Evaluation $PT Eval Low Complexity: 1 Procedure     PT G CodesSharen Heck PT [phone]  01/13/2017, 6:24 PM

## 2017-01-13 NOTE — Anesthesia Procedure Notes (Signed)
Anesthesia Regional Block:  Adductor canal block  Pre-Anesthetic Checklist: ,, timeout performed, Correct Patient, Correct Site, Correct Laterality, Correct Procedure, Correct Position, site marked, Risks and benefits discussed,  Surgical consent,  Pre-op evaluation,  At surgeon's request and post-op pain management  Laterality: Right  Prep: chloraprep       Needles:  Injection technique: Single-shot  Needle Type: Echogenic Needle     Needle Length: 9cm 9 cm Needle Gauge: 21 and 21 G    Additional Needles:  Procedures: ultrasound guided (picture in chart) Adductor canal block Narrative:  Start time: 01/13/2017 8:01 AM End time: 01/13/2017 8:06 AM Injection made incrementally with aspirations every 5 mL.  Performed by: Personally  Anesthesiologist: Shadavia Dampier  Additional Notes: Pt tolerated the procedure well.

## 2017-01-13 NOTE — Anesthesia Procedure Notes (Signed)
Spinal  Patient location during procedure: OR Start time: 01/13/2017 8:10 AM End time: 01/13/2017 8:17 AM Staffing Anesthesiologist: Chaney Malling, Chiquetta Langner Performed: anesthesiologist  Preanesthetic Checklist Completed: patient identified, site marked, surgical consent, pre-op evaluation, timeout performed, IV checked, risks and benefits discussed and monitors and equipment checked Spinal Block Patient position: sitting Prep: DuraPrep Patient monitoring: cardiac monitor, continuous pulse ox and blood pressure Approach: midline Location: L3-4 Injection technique: single-shot Needle Needle type: Pencan  Needle gauge: 24 G Needle length: 9 cm Assessment Sensory level: T10 Additional Notes Functioning IV was confirmed and monitors were applied. Sterile prep and drape, including hand hygiene and sterile gloves were used. The patient was positioned and the spine was prepped. The skin was anesthetized with lidocaine.  Free flow of clear CSF was obtained prior to injecting local anesthetic into the CSF.  The spinal needle aspirated freely following injection.  The needle was carefully withdrawn.  The patient tolerated the procedure well.

## 2017-01-13 NOTE — Interval H&P Note (Signed)
History and Physical Interval Note:  01/13/2017 6:40 AM  [name]  has presented today for surgery, with the diagnosis of RIGHT KNEE OA  The various methods of treatment have been discussed with the patient and family. After consideration of risks, benefits and other options for treatment, the patient has consented to  Procedure(s): RIGHT TOTAL KNEE ARTHROPLASTY (Right) as a surgical intervention .  The patient's history has been reviewed, patient examined, no change in status, stable for surgery.  I have reviewed the patient's chart and labs.  Questions were answered to the patient's satisfaction.     Loanne Drilling

## 2017-01-13 NOTE — Anesthesia Preprocedure Evaluation (Signed)
Anesthesia Evaluation  Patient identified by MRN, date of birth, ID band Patient awake    Reviewed: Allergy & Precautions, H&P , NPO status , Patient's Chart, lab work & pertinent test results  History of Anesthesia Complications (+) PONV and history of anesthetic complications  Airway Mallampati: II   Neck ROM: full    Dental   Pulmonary PE   breath sounds clear to auscultation       Cardiovascular negative cardio ROS   Rhythm:regular Rate:Normal     Neuro/Psych    GI/Hepatic   Endo/Other  diabetes, Type 2  Renal/GU      Musculoskeletal  (+) Arthritis ,   Abdominal   Peds  Hematology   Anesthesia Other Findings   Reproductive/Obstetrics                             Anesthesia Physical Anesthesia Plan  ASA: II  Anesthesia Plan: Spinal   Post-op Pain Management:  Regional for Post-op pain   Induction: Intravenous  Airway Management Planned: Simple Face Mask  Additional Equipment:   Intra-op Plan:   Post-operative Plan:   Informed Consent: I have reviewed the patients History and Physical, chart, labs and discussed the procedure including the risks, benefits and alternatives for the proposed anesthesia with the patient or authorized representative who has indicated his/her understanding and acceptance.     Plan Discussed with: CRNA, Anesthesiologist and Surgeon  Anesthesia Plan Comments:         Anesthesia Quick Evaluation

## 2017-01-14 LAB — CBC
HCT: 32 % — ABNORMAL LOW (ref 36.0–46.0)
Hemoglobin: 10.8 g/dL — ABNORMAL LOW (ref 12.0–15.0)
MCH: 29.6 pg (ref 26.0–34.0)
MCHC: 33.8 g/dL (ref 30.0–36.0)
MCV: 87.7 fL (ref 78.0–100.0)
PLATELETS: 164 10*3/uL (ref 150–400)
RBC: 3.65 MIL/uL — ABNORMAL LOW (ref 3.87–5.11)
RDW: 13.1 % (ref 11.5–15.5)
WBC: 12.6 10*3/uL — AB (ref 4.0–10.5)

## 2017-01-14 LAB — BASIC METABOLIC PANEL
ANION GAP: 6 (ref 5–15)
BUN: 11 mg/dL (ref 6–20)
CO2: 29 mmol/L (ref 22–32)
CREATININE: 0.67 mg/dL (ref 0.44–1.00)
Calcium: 8.7 mg/dL — ABNORMAL LOW (ref 8.9–10.3)
Chloride: 104 mmol/L (ref 101–111)
GFR calc Af Amer: >60 mL/min (ref >60)
GLUCOSE: 149 mg/dL — AB (ref 65–99)
Potassium: 4.4 mmol/L (ref 3.5–5.1)
Sodium: 139 mmol/L (ref 135–145)

## 2017-01-14 LAB — GLUCOSE, CAPILLARY
Glucose-Capillary: 148 mg/dL — ABNORMAL HIGH (ref 65–99)
Glucose-Capillary: 224 mg/dL — ABNORMAL HIGH (ref 65–99)
Glucose-Capillary: 238 mg/dL — ABNORMAL HIGH (ref 65–99)
Glucose-Capillary: 163 mg/dL — ABNORMAL HIGH (ref 65–99)

## 2017-01-14 MED ORDER — RIVAROXABAN 10 MG PO TABS: 10.0000 mg | ORAL_TABLET | Freq: Every day | ORAL | 0 refills | Status: DC

## 2017-01-14 MED ORDER — SODIUM CHLORIDE 0.9 % IV BOLUS (SEPSIS)
250.0000 mL | Freq: Once | INTRAVENOUS | Status: AC
  Administered 2017-01-14: 1000 mL via INTRAVENOUS

## 2017-01-14 MED ORDER — METHOCARBAMOL 500 MG PO TABS: 500.0000 mg | ORAL_TABLET | Freq: Four times a day (QID) | ORAL | 0 refills | Status: DC | PRN

## 2017-01-14 MED ORDER — OXYCODONE HCL 5 MG PO TABS: 5.0000 mg | ORAL_TABLET | ORAL | 0 refills | Status: DC | PRN

## 2017-01-14 NOTE — Discharge Instructions (Addendum)
° °Dr. Frank Aluisio °Total Joint Specialist °[facility] °[address] °[place], [place] [address] °[phone] ° °TOTAL KNEE REPLACEMENT POSTOPERATIVE DIRECTIONS ° °Knee Rehabilitation, Guidelines Following Surgery  °Results after knee surgery are often greatly improved when you follow the exercise, range of motion and muscle strengthening exercises prescribed by your doctor. Safety measures are also important to protect the knee from further injury. Any time any of these exercises cause you to have increased pain or swelling in your knee joint, decrease the amount until you are comfortable again and slowly increase them. If you have problems or questions, call your caregiver or physical therapist for advice.  ° °HOME CARE INSTRUCTIONS  °Remove items at home which could result in a fall. This includes throw rugs or furniture in walking pathways.  °· ICE to the affected knee every three hours for 30 minutes at a time and then as needed for pain and swelling.  Continue to use ice on the knee for pain and swelling from surgery. You may notice swelling that will progress down to the foot and ankle.  This is normal after surgery.  Elevate the leg when you are not up walking on it.   °· Continue to use the breathing machine which will help keep your temperature down.  It is common for your temperature to cycle up and down following surgery, especially at night when you are not up moving around and exerting yourself.  The breathing machine keeps your lungs expanded and your temperature down. °· Do not place pillow under knee, focus on keeping the knee straight while resting ° °DIET °You may resume your previous home diet once your are discharged from the hospital. ° °DRESSING / WOUND CARE / SHOWERING °You may shower 3 days after surgery, but keep the wounds dry during showering.  You may use an occlusive plastic wrap (Press'n Seal for example), NO SOAKING/SUBMERGING IN THE BATHTUB.  If the  bandage gets wet, change with a clean dry gauze.  If the incision gets wet, pat the wound dry with a clean towel. °You may start showering once you are discharged home but do not submerge the incision under water. Just pat the incision dry and apply a dry gauze dressing on daily. °Change the surgical dressing daily and reapply a dry dressing each time. ° °ACTIVITY °Walk with your walker as instructed. °Use walker as long as suggested by your caregivers. °Avoid periods of inactivity such as sitting longer than an hour when not asleep. This helps prevent blood clots.  °You may resume a sexual relationship in one month or when given the OK by your doctor.  °You may return to work once you are cleared by your doctor.  °Do not drive a car for 6 weeks or until released by you surgeon.  °Do not drive while taking narcotics. ° °WEIGHT BEARING °Weight bearing as tolerated with assist device (walker, cane, etc) as directed, use it as long as suggested by your surgeon or therapist, typically at least 4-6 weeks. ° °POSTOPERATIVE CONSTIPATION PROTOCOL °Constipation - defined medically as fewer than three stools per week and severe constipation as less than one stool per week. ° °One of the most common issues patients have following surgery is constipation.  Even if you have a regular bowel pattern at home, your normal regimen is likely to be disrupted due to multiple reasons following surgery.  Combination of anesthesia, postoperative narcotics, change in appetite and fluid intake all can affect your bowels.    In order to avoid complications following surgery, here are some recommendations in order to help you during your recovery period. ° °Colace (docusate) - Pick up an over-the-counter form of Colace or another stool softener and take twice a day as long as you are requiring postoperative pain medications.  Take with a full glass of water daily.  If you experience loose stools or diarrhea, hold the colace until you stool forms  back up.  If your symptoms do not get better within 1 week or if they get worse, check with your doctor. ° °Dulcolax (bisacodyl) - Pick up over-the-counter and take as directed by the product packaging as needed to assist with the movement of your bowels.  Take with a full glass of water.  Use this product as needed if not relieved by Colace only.  ° °MiraLax (polyethylene glycol) - Pick up over-the-counter to have on hand.  MiraLax is a solution that will increase the amount of water in your bowels to assist with bowel movements.  Take as directed and can mix with a glass of water, juice, soda, coffee, or tea.  Take if you go more than two days without a movement. °Do not use MiraLax more than once per day. Call your doctor if you are still constipated or irregular after using this medication for 7 days in a row. ° °If you continue to have problems with postoperative constipation, please contact the office for further assistance and recommendations.  If you experience "the worst abdominal pain ever" or develop nausea or vomiting, please contact the office immediatly for further recommendations for treatment. ° °ITCHING ° If you experience itching with your medications, try taking only a single pain pill, or even half a pain pill at a time.  You can also use Benadryl over the counter for itching or also to help with sleep.  ° °TED HOSE STOCKINGS °Wear the elastic stockings on both legs for three weeks following surgery during the day but you may remove then at night for sleeping. ° °MEDICATIONS °See your medication summary on the “After Visit Summary” that the nursing staff will review with you prior to discharge.  You may have some home medications which will be placed on hold until you complete the course of blood thinner medication.  It is important for you to complete the blood thinner medication as prescribed by your surgeon.  Continue your approved medications as instructed at time of  discharge. ° °PRECAUTIONS °If you experience chest pain or shortness of breath - call 911 immediately for transfer to the hospital emergency department.  °If you develop a fever greater that 101 F, purulent drainage from wound, increased redness or drainage from wound, foul odor from the wound/dressing, or calf pain - CONTACT YOUR SURGEON.   °                                                °FOLLOW-UP APPOINTMENTS °Make sure you keep all of your appointments after your operation with your surgeon and caregivers. You should call the office at the above phone number and make an appointment for approximately two weeks after the date of your surgery or on the date instructed by your surgeon outlined in the "After Visit Summary". ° ° °RANGE OF MOTION AND STRENGTHENING EXERCISES  °Rehabilitation of the knee is important following a knee injury or   an operation. After just a few days of immobilization, the muscles of the thigh which control the knee become weakened and shrink (atrophy). Knee exercises are designed to build up the tone and strength of the thigh muscles and to improve knee motion. Often times heat used for twenty to thirty minutes before working out will loosen up your tissues and help with improving the range of motion but do not use heat for the first two weeks following surgery. These exercises can be done on a training (exercise) mat, on the floor, on a table or on a bed. Use what ever works the best and is most comfortable for you Knee exercises include:  °Leg Lifts - While your knee is still immobilized in a splint or cast, you can do straight leg raises. Lift the leg to 60 degrees, hold for 3 sec, and slowly lower the leg. Repeat 10-20 times 2-3 times daily. Perform this exercise against resistance later as your knee gets better.  °Quad and Hamstring Sets - Tighten up the muscle on the front of the thigh (Quad) and hold for 5-10 sec. Repeat this 10-20 times hourly. Hamstring sets are done by pushing the  foot backward against an object and holding for 5-10 sec. Repeat as with quad sets.  °· Leg Slides: Lying on your back, slowly slide your foot toward your buttocks, bending your knee up off the floor (only go as far as is comfortable). Then slowly slide your foot back down until your leg is flat on the floor again. °· Angel Wings: Lying on your back spread your legs to the side as far apart as you can without causing discomfort.  °A rehabilitation program following serious knee injuries can speed recovery and prevent re-injury in the future due to weakened muscles. Contact your doctor or a physical therapist for more information on knee rehabilitation.  ° °IF YOU ARE TRANSFERRED TO A SKILLED REHAB FACILITY °If the patient is transferred to a skilled rehab facility following release from the hospital, a list of the current medications will be sent to the facility for the patient to continue.  When discharged from the skilled rehab facility, please have the facility set up the patient's Home Health Physical Therapy prior to being released. Also, the skilled facility will be responsible for providing the patient with their medications at time of release from the facility to include their pain medication, the muscle relaxants, and their blood thinner medication. If the patient is still at the rehab facility at time of the two week follow up appointment, the skilled rehab facility will also need to assist the patient in arranging follow up appointment in our office and any transportation needs. ° °MAKE SURE YOU:  °Understand these instructions.  °Get help right away if you are not doing well or get worse.  ° ° °Pick up stool softner and laxative for home use following surgery while on pain medications. °Do not submerge incision under water. °Please use good hand washing techniques while changing dressing each day. °May shower starting three days after surgery. °Please use a clean towel to pat the incision dry following  showers. °Continue to use ice for pain and swelling after surgery. °Do not use any lotions or creams on the incision until instructed by your surgeon. ° °Take Xarelto for two and a half more weeks following discharge from the hospital, then discontinue Xarelto. °Once the patient has completed the blood thinner regimen, then take a Baby 81 mg Aspirin daily for three   more weeks. ° °Information on my medicine - XARELTO® (Rivaroxaban) ° °This medication education was reviewed with me or my healthcare representative as part of my discharge preparation.  The pharmacist that spoke with me during my hospital stay was:  Qunicy Higinbotham, RPH ° °Why was Xarelto® prescribed for you? °Xarelto® was prescribed for you to reduce the risk of blood clots forming after orthopedic surgery. The medical term for these abnormal blood clots is venous thromboembolism (VTE). ° °What do you need to know about xarelto® ? °Take your Xarelto® ONCE DAILY at the same time every day. °You may take it either with or without food. ° °If you have difficulty swallowing the tablet whole, you may crush it and mix in applesauce just prior to taking your dose. ° °Take Xarelto® exactly as prescribed by your doctor and DO NOT stop taking Xarelto® without talking to the doctor who prescribed the medication.  Stopping without other VTE prevention medication to take the place of Xarelto® may increase your risk of developing a clot. ° °After discharge, you should have regular check-up appointments with your healthcare provider that is prescribing your Xarelto®.   ° °What do you do if you miss a dose? °If you miss a dose, take it as soon as you remember on the same day then continue your regularly scheduled once daily regimen the next day. Do not take two doses of Xarelto® on the same day.  ° °Important Safety Information °A possible side effect of Xarelto® is bleeding. You should call your healthcare provider right away if you experience any of the  following: °? Bleeding from an injury or your nose that does not stop. °? Unusual colored urine (red or dark brown) or unusual colored stools (red or black). °? Unusual bruising for unknown reasons. °? A serious fall or if you hit your head (even if there is no bleeding). ° °Some medicines may interact with Xarelto® and might increase your risk of bleeding while on Xarelto®. To help avoid this, consult your healthcare provider or pharmacist prior to using any new prescription or non-prescription medications, including herbals, vitamins, non-steroidal anti-inflammatory drugs (NSAIDs) and supplements. ° °This website has more information on Xarelto®: www.xarelto.com. ° ° ° °

## 2017-01-14 NOTE — Progress Notes (Signed)
   Subjective: 1 Day Post-Op Procedure(s) (LRB): RIGHT TOTAL KNEE ARTHROPLASTY (Right) Patient reports pain as mild.   Patient seen in rounds with Dr. Lequita Halt. Patient is well, and has had no acute complaints or problems We will start therapy today.  If they do well with therapy and meets all goals, then will allow home later this afternoon following therapy. Plan is to go Home after hospital stay.  Objective: Vital signs in last 24 hours: Temp:  [97.5 F (36.4 C)-98.2 F (36.8 C)] 97.5 F (36.4 C) (02/06 0536) Pulse Rate:  [56-76] 57 (02/06 0536) Resp:  [11-17] 16 (02/06 0536) BP: (97-123)/(47-79) 115/65 (02/06 0536) SpO2:  [97 %-100 %] 97 % (02/06 0536)  Intake/Output from previous day:  Intake/Output Summary (Last 24 hours) at 01/14/17 0859 Last data filed at 01/14/17 0752  Gross per 24 hour  Intake          3331.25 ml  Output             4355 ml  Net         -1023.75 ml    Intake/Output this shift: Total I/O In: 120 [P.O.:120] Out: -   Labs:  Recent Labs  01/14/17 0441  HGB 10.8*    Recent Labs  01/14/17 0441  WBC 12.6*  RBC 3.65*  HCT 32.0*  PLT 164    Recent Labs  01/14/17 0441  NA 139  K 4.4  CL 104  CO2 29  BUN 11  CREATININE 0.67  GLUCOSE 149*  CALCIUM 8.7*   No results for input(s): LABPT, INR in the last 72 hours.  EXAM General - Patient is Alert and Appropriate Extremity - Neurovascular intact Sensation intact distally Dressing - dressing C/D/I Motor Function - intact, moving foot and toes well on exam.  Hemovac pulled without difficulty.  Past Medical History:  Diagnosis Date  . Arthritis   . Diabetes mellitus without complication (HCC)   . PONV (postoperative nausea and vomiting)   . Pulmonary embolism (HCC) 2005    Assessment/Plan: 1 Day Post-Op Procedure(s) (LRB): RIGHT TOTAL KNEE ARTHROPLASTY (Right) Principal Problem:   OA (osteoarthritis) of knee  Estimated body mass index is 26.61 kg/m as calculated from the  following:   Height as of this encounter: 5\' 8"  (1.727 m).   Weight as of this encounter: 79.4 kg (175 lb). Up with therapy  DVT Prophylaxis - Xarelto Weight-Bearing as tolerated to right leg D/C O2 and Pulse OX and try on Room Air  If meets goals and able to go home:  Diet - Diabetic diet Follow up - in 2 weeks Activity - WBAT Disposition - Home Condition Upon Discharge - pending D/C Meds - See DC Summary DVT Prophylaxis - Xarelto  , PA-C Orthopaedic Surgery

## 2017-01-14 NOTE — Progress Notes (Signed)
Physical Therapy Treatment Patient Details Name: [name] MRN: [id number] DOB: 1956/04/08 Today's Date: 01/14/2017    History of Present Illness R TKA    PT Comments    Patient is progressing well. Plans Dc tomorrow.  Follow Up Recommendations  Home health PT;Supervision/Assistance - 24 hour     Equipment Recommendations  None recommended by PT    Recommendations for Other Services       Precautions / Restrictions Precautions Precautions: Knee Required Braces or Orthoses: Knee Immobilizer - Right Knee Immobilizer - Right: Discontinue once straight leg raise with < 10 degree lag    Mobility  Bed Mobility   Bed Mobility: Sit to Supine       Sit to supine: Min guard      Transfers Overall transfer level: Needs assistance Equipment used: Rolling walker (2 wheeled) Transfers: Sit to/from Stand Sit to Stand: Supervision         General transfer comment: cues for UE/LE placement  Ambulation/Gait Ambulation/Gait assistance: Supervision Ambulation Distance (Feet): 100 Feet Assistive device: Rolling walker (2 wheeled) Gait Pattern/deviations: Step-to pattern;Step-through pattern;Antalgic         Stairs            Wheelchair Mobility    Modified Rankin (Stroke Patients Only)       Balance                                    Cognition Arousal/Alertness: Awake/alert                          Exercises Total Joint Exercises Ankle Circles/Pumps: 10 reps;Both Quad Sets: Right;Left;10 reps Towel Squeeze: 10 reps Short Arc Quad: Right;10 reps Heel Slides: Right Hip ABduction/ADduction: AAROM;Right;10 reps Straight Leg Raises: AAROM;Right;10 reps Goniometric ROM: 10-60 right knee flexion    General Comments        Pertinent Vitals/Pain Pain Score: 4  Pain Location: R knee Pain Descriptors / Indicators: Aching Pain Intervention(s): Monitored during session;Premedicated before session;Repositioned    Home  Living                      Prior Function            PT Goals (current goals can now be found in the care plan section) Progress towards PT goals: Progressing toward goals    Frequency    7X/week      PT Plan Current plan remains appropriate    Co-evaluation             End of Session Equipment Utilized During Treatment: Right knee immobilizer Activity Tolerance: Patient tolerated treatment well Patient left: in bed;with family/visitor present     Time: 1208-1236 PT Time Calculation (min) (ACUTE ONLY): 28 min  Charges:  $Gait Training: 8-22 mins $Therapeutic Exercise: 8-22 mins                    G Codes:      Rada Hay 01/14/2017, 2:58 PM

## 2017-01-14 NOTE — Progress Notes (Signed)
Physical Therapy Treatment Patient Details Name: [name] MRN: [id number] DOB: 1956-01-11 Today's Date: 01/14/2017    History of Present Illness R TKA    PT Comments    POD # 1 pm session Pt requested pain meds prior to pm session.  Returned then assisted OOB to bathroom (void 850) then applied KI.  Instructed on proper use for long walks and stairs.  Instructed on proper application.  Amb in hallway a decreased distance due to pain level and fatigue.  Assisted back to bed to perform some TKR TE's followed by ICE.    Follow Up Recommendations  Home health PT;Supervision/Assistance - 24 hour (pt going to her mother house 2 step entry and mom is 90)     Equipment Recommendations  None recommended by PT    Recommendations for Other Services       Precautions / Restrictions Precautions Precautions: Knee Precaution Comments: instructed on KI use for long walks and stairs Required Braces or Orthoses: Knee Immobilizer - Right Knee Immobilizer - Right: Discontinue once straight leg raise with < 10 degree lag Restrictions Weight Bearing Restrictions: No Other Position/Activity Restrictions: WBAT    Mobility  Bed Mobility Overal bed mobility: Needs Assistance Bed Mobility: Sit to Supine;Supine to Sit     Supine to sit: Min assist Sit to supine: Min assist   General bed mobility comments: assist for R LE OOB and back onto bed  Transfers Overall transfer level: Needs assistance Equipment used: Rolling walker (2 wheeled) Transfers: Sit to/from Stand Sit to Stand: Supervision;Min guard         General transfer comment: 25% cues for UE/LE placement and also assisted in bathroom  Ambulation/Gait Ambulation/Gait assistance: Min guard;Min assist Ambulation Distance (Feet): 74 Feet Assistive device: Rolling walker (2 wheeled) Gait Pattern/deviations: Step-to pattern;Decreased stance time - right Gait velocity: decreased   General Gait Details: 25% VC's on safety with  turns with walker.  Decreased amb distance due to increased c/o pain and fatigue.    Stairs            Wheelchair Mobility    Modified Rankin (Stroke Patients Only)       Balance                                    Cognition Arousal/Alertness: Awake/alert Behavior During Therapy: WFL for tasks assessed/performed Overall Cognitive Status: Within Functional Limits for tasks assessed                      Exercises Total Joint Exercises Ankle Circles/Pumps: 10 reps;Both Quad Sets: Right;Left;10 reps Towel Squeeze: 10 reps Short Arc Quad: Right;10 reps Heel Slides: Right Hip ABduction/ADduction: AAROM;Right;10 reps Straight Leg Raises: AAROM;Right;10 reps Goniometric ROM: 10-60 right knee flexion    General Comments        Pertinent Vitals/Pain Pain Assessment: 0-10 Pain Score: 6  Pain Location: R knee Pain Descriptors / Indicators: Grimacing;Operative site guarding;Discomfort;Sore Pain Intervention(s): Monitored during session;Repositioned;Ice applied;Premedicated before session    Home Living                      Prior Function            PT Goals (current goals can now be found in the care plan section) Progress towards PT goals: Progressing toward goals    Frequency    7X/week  PT Plan Current plan remains appropriate    Co-evaluation             End of Session Equipment Utilized During Treatment: Right knee immobilizer Activity Tolerance: Patient limited by fatigue;Patient limited by pain Patient left: in bed;with bed alarm set;with call bell/phone within reach;with family/visitor present     Time: 1287-8676 PT Time Calculation (min) (ACUTE ONLY): 26 min  Charges:  $Gait Training: 8-22 mins $Therapeutic Exercise: 8-22 mins                    G Codes:      Felecia Shelling  PTA [facility]  Acute  Rehab Pager      [phone]

## 2017-01-14 NOTE — Discharge Summary (Signed)
Physician Discharge Summary   Patient ID: [name] MRN: [id number] DOB/AGE: 1956/04/08 61 y.o.  Admit date: 01/13/2017 Discharge date: 01-15-2017  Primary Diagnosis:  Osteoarthritis  Right knee(s)  Admission Diagnoses:  Past Medical History:  Diagnosis Date  . Arthritis   . Diabetes mellitus without complication ([place])   . PONV (postoperative nausea and vomiting)   . Pulmonary embolism ([place]) 2005   Discharge Diagnoses:   Principal Problem:   OA (osteoarthritis) of knee  Estimated body mass index is 26.61 kg/m as calculated from the following:   Height as of this encounter: 5' 8"  (1.727 m).   Weight as of this encounter: 79.4 kg (175 lb).  Procedure:  Procedure(s) (LRB): RIGHT TOTAL KNEE ARTHROPLASTY (Right)   Consults: None  HPI: [name] is a 61 y.o. year old female with end stage OA of her right knee with progressively worsening pain and dysfunction. She has constant pain, with activity and at rest and significant functional deficits with difficulties even with ADLs. She has had extensive non-op management including analgesics, injections of cortisone and viscosupplements, and home exercise program, but remains in significant pain with significant dysfunction.Radiographs show bone on bone arthritis medial and patellofemoral. She presents now for right Total Knee Arthroplasty.    Laboratory Data: Admission on 01/13/2017  Component Date Value Ref Range Status  . Glucose-Capillary 01/13/2017 149* 65 - 99 mg/dL Final  . Comment 1 01/13/2017 Notify RN   Final  . Glucose-Capillary 01/13/2017 164* 65 - 99 mg/dL Final  . Comment 1 01/13/2017 Notify RN   Final  . Comment 2 01/13/2017 Document in Chart   Final  . Glucose-Capillary 01/13/2017 167* 65 - 99 mg/dL Final  . WBC 01/14/2017 12.6* 4.0 - 10.5 K/uL Final  . RBC 01/14/2017 3.65* 3.87 - 5.11 MIL/uL Final  . Hemoglobin 01/14/2017 10.8* 12.0 - 15.0 g/dL Final  . HCT 01/14/2017 32.0* 36.0 - 46.0 % Final  . MCV  01/14/2017 87.7  78.0 - 100.0 fL Final  . MCH 01/14/2017 29.6  26.0 - 34.0 pg Final  . MCHC 01/14/2017 33.8  30.0 - 36.0 g/dL Final  . RDW 01/14/2017 13.1  11.5 - 15.5 % Final  . Platelets 01/14/2017 164  150 - 400 K/uL Final  . Sodium 01/14/2017 139  135 - 145 mmol/L Final  . Potassium 01/14/2017 4.4  3.5 - 5.1 mmol/L Final  . Chloride 01/14/2017 104  101 - 111 mmol/L Final  . CO2 01/14/2017 29  22 - 32 mmol/L Final  . Glucose, Bld 01/14/2017 149* 65 - 99 mg/dL Final  . BUN 01/14/2017 11  6 - 20 mg/dL Final  . Creatinine, Ser 01/14/2017 0.67  0.44 - 1.00 mg/dL Final  . Calcium 01/14/2017 8.7* 8.9 - 10.3 mg/dL Final  . GFR calc non Af Amer 01/14/2017 >60  >60 mL/min Final  . GFR calc Af Amer 01/14/2017 >60  >60 mL/min Final   Comment: (NOTE) The eGFR has been calculated using the CKD EPI equation. This calculation has not been validated in all clinical situations. eGFR's persistently <60 mL/min signify possible Chronic Kidney Disease.   . Anion gap 01/14/2017 6  5 - 15 Final  . Glucose-Capillary 01/13/2017 229* 65 - 99 mg/dL Final  . Glucose-Capillary 01/13/2017 141* 65 - 99 mg/dL Final  . Glucose-Capillary 01/14/2017 148* 65 - 99 mg/dL Final  . Glucose-Capillary 01/14/2017 224* 65 - 99 mg/dL Final  . WBC 01/15/2017 12.5* 4.0 - 10.5 K/uL Final  . RBC 01/15/2017 3.57*  3.87 - 5.11 MIL/uL Final  . Hemoglobin 01/15/2017 10.0* 12.0 - 15.0 g/dL Final  . HCT 01/15/2017 30.6* 36.0 - 46.0 % Final  . MCV 01/15/2017 85.7  78.0 - 100.0 fL Final  . MCH 01/15/2017 28.0  26.0 - 34.0 pg Final  . MCHC 01/15/2017 32.7  30.0 - 36.0 g/dL Final  . RDW 01/15/2017 13.0  11.5 - 15.5 % Final  . Platelets 01/15/2017 157  150 - 400 K/uL Final  . Sodium 01/15/2017 137  135 - 145 mmol/L Final  . Potassium 01/15/2017 3.4* 3.5 - 5.1 mmol/L Final   Comment: DELTA CHECK NOTED REPEATED TO VERIFY   . Chloride 01/15/2017 101  101 - 111 mmol/L Final  . CO2 01/15/2017 28  22 - 32 mmol/L Final  . Glucose, Bld  01/15/2017 144* 65 - 99 mg/dL Final  . BUN 01/15/2017 10  6 - 20 mg/dL Final  . Creatinine, Ser 01/15/2017 0.63  0.44 - 1.00 mg/dL Final  . Calcium 01/15/2017 8.4* 8.9 - 10.3 mg/dL Final  . GFR calc non Af Amer 01/15/2017 >60  >60 mL/min Final  . GFR calc Af Amer 01/15/2017 >60  >60 mL/min Final   Comment: (NOTE) The eGFR has been calculated using the CKD EPI equation. This calculation has not been validated in all clinical situations. eGFR's persistently <60 mL/min signify possible Chronic Kidney Disease.   . Anion gap 01/15/2017 8  5 - 15 Final  . Glucose-Capillary 01/14/2017 238* 65 - 99 mg/dL Final  . Glucose-Capillary 01/14/2017 163* 65 - 99 mg/dL Final  . Glucose-Capillary 01/15/2017 132* 65 - 99 mg/dL Final  . Glucose-Capillary 01/15/2017 198* 65 - 99 mg/dL Final  Hospital Outpatient Visit on 01/08/2017  Component Date Value Ref Range Status  . Glucose-Capillary 01/08/2017 120* 65 - 99 mg/dL Final  . MRSA, PCR 01/08/2017 NEGATIVE  NEGATIVE Final  . Staphylococcus aureus 01/08/2017 NEGATIVE  NEGATIVE Final   Comment:        The Xpert SA Assay (FDA approved for NASAL specimens in patients over 34 years of age), is one component of a comprehensive surveillance program.  Test performance has been validated by [facility] for patients greater than or equal to 51 year old. It is not intended to diagnose infection nor to guide or monitor treatment.   Marland Kitchen aPTT 01/08/2017 26  24 - 36 seconds Final  . WBC 01/08/2017 7.1  4.0 - 10.5 K/uL Final  . RBC 01/08/2017 4.53  3.87 - 5.11 MIL/uL Final  . Hemoglobin 01/08/2017 13.1  12.0 - 15.0 g/dL Final  . HCT 01/08/2017 39.7  36.0 - 46.0 % Final  . MCV 01/08/2017 87.6  78.0 - 100.0 fL Final  . MCH 01/08/2017 28.9  26.0 - 34.0 pg Final  . MCHC 01/08/2017 33.0  30.0 - 36.0 g/dL Final  . RDW 01/08/2017 12.9  11.5 - 15.5 % Final  . Platelets 01/08/2017 205  150 - 400 K/uL Final  . Sodium 01/08/2017 141  135 - 145 mmol/L Final  . Potassium  01/08/2017 4.2  3.5 - 5.1 mmol/L Final  . Chloride 01/08/2017 106  101 - 111 mmol/L Final  . CO2 01/08/2017 26  22 - 32 mmol/L Final  . Glucose, Bld 01/08/2017 134* 65 - 99 mg/dL Final  . BUN 01/08/2017 12  6 - 20 mg/dL Final  . Creatinine, Ser 01/08/2017 0.77  0.44 - 1.00 mg/dL Final  . Calcium 01/08/2017 9.3  8.9 - 10.3 mg/dL Final  . Total Protein  01/08/2017 7.2  6.5 - 8.1 g/dL Final  . Albumin 01/08/2017 4.6  3.5 - 5.0 g/dL Final  . AST 01/08/2017 20  15 - 41 U/L Final  . ALT 01/08/2017 18  14 - 54 U/L Final  . Alkaline Phosphatase 01/08/2017 86  38 - 126 U/L Final  . Total Bilirubin 01/08/2017 0.6  0.3 - 1.2 mg/dL Final  . GFR calc non Af Amer 01/08/2017 >60  >60 mL/min Final  . GFR calc Af Amer 01/08/2017 >60  >60 mL/min Final   Comment: (NOTE) The eGFR has been calculated using the CKD EPI equation. This calculation has not been validated in all clinical situations. eGFR's persistently <60 mL/min signify possible Chronic Kidney Disease.   . Anion gap 01/08/2017 9  5 - 15 Final  . Prothrombin Time 01/08/2017 12.8  11.4 - 15.2 seconds Final  . INR 01/08/2017 0.97   Final  . ABO/RH(D) 01/08/2017 A POS   Final  . Antibody Screen 01/08/2017 NEG   Final  . Sample Expiration 01/08/2017 01/16/2017   Final  . Extend sample reason 01/08/2017 NO TRANSFUSIONS OR PREGNANCY IN THE PAST 3 MONTHS   Final  . Hgb A1c MFr Bld 01/08/2017 7.0* 4.8 - 5.6 % Final   Comment: (NOTE)         Pre-diabetes: 5.7 - 6.4         Diabetes: >6.4         Glycemic control for adults with diabetes: <7.0   . Mean Plasma Glucose 01/08/2017 154  mg/dL Final   Comment: (NOTE) Performed At: [facility] [place], [place] [id number] Lindon Romp MD SP:[id number]   . ABO/RH(D) 01/08/2017 A POS   Final     X-Rays:No results found.  EKG: Orders placed or performed during the hospital encounter of 01/08/17  . EKG 12-Lead  . EKG 12-Lead     Hospital Course: [name] is a 61  y.o. who was admitted to [facility]. They were brought to the operating room on 01/13/2017 and underwent Procedure(s): RIGHT TOTAL KNEE ARTHROPLASTY.  Patient tolerated the procedure well and was later transferred to the recovery room and then to the orthopaedic floor for postoperative care.  They were given PO and IV analgesics for pain control following their surgery.  They were given 24 hours of postoperative antibiotics of  Anti-infectives    Start     Dose/Rate Route Frequency Ordered Stop   01/13/17 1400  ceFAZolin (ANCEF) IVPB 2g/100 mL premix     2 g 200 mL/hr over 30 Minutes Intravenous Every 6 hours 01/13/17 1122 01/13/17 2007   01/13/17 0608  ceFAZolin (ANCEF) IVPB 2g/100 mL premix     2 g 200 mL/hr over 30 Minutes Intravenous On call to O.R. 01/13/17 3335 01/13/17 0825     and started on DVT prophylaxis in the form of Xarelto.   PT and OT were ordered for total joint protocol.  Discharge planning consulted to help with postop disposition and equipment needs.  Patient had a decent night on the evening of surgery.  They started to get up OOB with therapy on day one. Hemovac drain was pulled without difficulty.  Continued to work with therapy into day two.  Dressing was changed on day two and the incision was healing well. Patient was seen in rounds on day two and was ready to go home.   Discharge home with home health Diet - Diabetic diet Follow up - in 2  weeks Activity - WBAT Disposition - Home Condition Upon Discharge - improving D/C Meds - See DC Summary DVT Prophylaxis - Xarelto  Discharge Instructions    Call MD / Call 911    Complete by:  As directed    If you experience chest pain or shortness of breath, CALL 911 and be transported to the hospital emergency room.  If you develope a fever above 101 F, pus (white drainage) or increased drainage or redness at the wound, or calf pain, call your surgeon's office.   Change dressing    Complete by:  As directed     Change dressing daily with sterile 4 x 4 inch gauze dressing and apply TED hose. Do not submerge the incision under water.   Constipation Prevention    Complete by:  As directed    Drink plenty of fluids.  Prune juice may be helpful.  You may use a stool softener, such as Colace (over the counter) 100 mg twice a day.  Use MiraLax (over the counter) for constipation as needed.   Diet - low sodium heart healthy    Complete by:  As directed    Discharge instructions    Complete by:  As directed    Pick up stool softner and laxative for home use following surgery while on pain medications. Do not submerge incision under water. Please use good hand washing techniques while changing dressing each day. May shower starting three days after surgery. Please use a clean towel to pat the incision dry following showers. Continue to use ice for pain and swelling after surgery. Do not use any lotions or creams on the incision until instructed by your surgeon.  Wear both TED hose on both legs during the day every day for three weeks, but may have off at night at home.  Postoperative Constipation Protocol  Constipation - defined medically as fewer than three stools per week and severe constipation as less than one stool per week.  One of the most common issues patients have following surgery is constipation.  Even if you have a regular bowel pattern at home, your normal regimen is likely to be disrupted due to multiple reasons following surgery.  Combination of anesthesia, postoperative narcotics, change in appetite and fluid intake all can affect your bowels.  In order to avoid complications following surgery, here are some recommendations in order to help you during your recovery period.  Colace (docusate) - Pick up an over-the-counter form of Colace or another stool softener and take twice a day as long as you are requiring postoperative pain medications.  Take with a full glass of water daily.  If you  experience loose stools or diarrhea, hold the colace until you stool forms back up.  If your symptoms do not get better within 1 week or if they get worse, check with your doctor.  Dulcolax (bisacodyl) - Pick up over-the-counter and take as directed by the product packaging as needed to assist with the movement of your bowels.  Take with a full glass of water.  Use this product as needed if not relieved by Colace only.   MiraLax (polyethylene glycol) - Pick up over-the-counter to have on hand.  MiraLax is a solution that will increase the amount of water in your bowels to assist with bowel movements.  Take as directed and can mix with a glass of water, juice, soda, coffee, or tea.  Take if you go more than two days without a movement. Do not  use MiraLax more than once per day. Call your doctor if you are still constipated or irregular after using this medication for 7 days in a row.  If you continue to have problems with postoperative constipation, please contact the office for further assistance and recommendations.  If you experience "the worst abdominal pain ever" or develop nausea or vomiting, please contact the office immediatly for further recommendations for treatment.   Take Xarelto for two and a half more weeks, then discontinue Xarelto. Once the patient has completed the blood thinner regimen, then take a Baby 81 mg Aspirin daily for three more weeks.   Do not put a pillow under the knee. Place it under the heel.    Complete by:  As directed    Do not sit on low chairs, stoools or toilet seats, as it may be difficult to get up from low surfaces    Complete by:  As directed    Driving restrictions    Complete by:  As directed    No driving until released by the physician.   Increase activity slowly as tolerated    Complete by:  As directed    Lifting restrictions    Complete by:  As directed    No lifting until released by the physician.   Patient may shower    Complete by:  As  directed    You may shower without a dressing once there is no drainage.  Do not wash over the wound.  If drainage remains, do not shower until drainage stops.   TED hose    Complete by:  As directed    Use stockings (TED hose) for 3 weeks on both leg(s).  You may remove them at night for sleeping.   Weight bearing as tolerated    Complete by:  As directed    Laterality:  right   Extremity:  Lower     Allergies as of 01/15/2017      Reactions   Aspirin Other (See Comments)   "burns my stomach"      Medication List    STOP taking these medications   cholecalciferol 1000 units tablet Commonly known as:  VITAMIN D   HYDROcodone-acetaminophen 10-325 MG tablet Commonly known as:  NORCO   hydroxychloroquine 200 MG tablet Commonly known as:  PLAQUENIL     TAKE these medications   albuterol 108 (90 Base) MCG/ACT inhaler Commonly known as:  PROVENTIL HFA;VENTOLIN HFA Inhale 2 puffs into the lungs every 6 (six) hours as needed for wheezing or shortness of breath.   cycloSPORINE 0.05 % ophthalmic emulsion Commonly known as:  RESTASIS Place 1 drop into both eyes 2 (two) times daily.   escitalopram 20 MG tablet Commonly known as:  LEXAPRO Take 10 mg by mouth at bedtime.   Eszopiclone 3 MG Tabs Take 3 mg by mouth at bedtime. Take immediately before bedtime   fluticasone 50 MCG/ACT nasal spray Commonly known as:  FLONASE Place 1 spray into both nostrils daily as needed for allergies.   HYDROmorphone 2 MG tablet Commonly known as:  DILAUDID Take 1-2 tablets (2-4 mg total) by mouth every 4 (four) hours as needed for moderate pain or severe pain.   lovastatin 20 MG tablet Commonly known as:  MEVACOR Take 20 mg by mouth at bedtime.   metformin 1000 MG (OSM) 24 hr tablet Commonly known as:  FORTAMET Take 1,000 mg by mouth daily with supper.   methocarbamol 500 MG tablet Commonly known as:  ROBAXIN  Take 1 tablet (500 mg total) by mouth every 6 (six) hours as needed for  muscle spasms.   pregabalin 75 MG capsule Commonly known as:  LYRICA Take 75 mg by mouth 3 (three) times daily.   rivaroxaban 10 MG Tabs tablet Commonly known as:  XARELTO Take 1 tablet (10 mg total) by mouth daily with breakfast. Take Xarelto for two and a half more weeks following discharge from the hospital, then discontinue Xarelto. Once the patient has completed the blood thinner regimen, then take a Baby 81 mg Aspirin daily for three more weeks.      Follow-up Information    Gearlean Alf, MD. Schedule an appointment as soon as possible for a visit on 01/28/2017.   Specialty:  Orthopedic Surgery Contact information: [address]  [place] [address] [phone]        [facility] Follow up.   Specialty:  [place] Why:  home health physical therapy Contact information: [place] [address] [phone]           Signed: Arlee Muslim, PA-C Orthopaedic Surgery 01/15/2017, 5:01 PM

## 2017-01-14 NOTE — Care Management Note (Signed)
Case Management Note  Patient Details  Name: [name] MRN: [id number] Date of Birth: [birth year]  Subjective/Objective:                  Right  Total Knee Arthroplasty Action/Plan: Discharge planning Expected Discharge Date:  01/14/17               Expected Discharge Plan:  [place]  In-House Referral:     Discharge planning Services  CM Consult  Post Acute Care Choice:  Home Health Choice offered to:  Patient  DME Arranged:  N/A DME Agency:  NA  HH Arranged:  PT [place] Agency:  [facility] (formerly [facility])  Status of Service:  Completed, signed off  If discussed at [employer], dates discussed:    Additional Comments: CM met with pt in room to offer choice of home health agency. Pt chooses [facility] to render HHPT. Referral given to [facility] rep, Tim with pt's recuperating address: [address] [place], [place] [address] and contact number is [phone]. Pt states she has all DME needed at home. No other CM needs were communicated. Dellie Catholic, RN 01/14/2017, 10:54 AM

## 2017-01-14 NOTE — Addendum Note (Signed)
Addendum  created 01/14/17 0758 by Elyn Peers, CRNA   Charge Capture section accepted

## 2017-01-14 NOTE — Evaluation (Signed)
Occupational Therapy Evaluation Patient Details Name: [name] MRN: [id number] DOB: 07/19/1956 Today's Date: 01/14/2017    History of Present Illness R TKA   Clinical Impression   This 61 year old female was admitted for the above sx. All education was completed. No further OT is needed at this time    Follow Up Recommendations  No OT follow up    Equipment Recommendations  None recommended by OT    Recommendations for Other Services       Precautions / Restrictions Precautions Precautions: Knee Required Braces or Orthoses: Knee Immobilizer - Right Knee Immobilizer - Right: Discontinue once straight leg raise with < 10 degree lag Restrictions Weight Bearing Restrictions: No      Mobility Bed Mobility               General bed mobility comments: oob  Transfers   Equipment used: Rolling walker (2 wheeled)   Sit to Stand: Min guard         General transfer comment: cues for UE/LE placement    Balance                                            ADL Overall ADL's : Needs assistance/impaired     Grooming: Oral care;Supervision/safety;Standing                   Toilet Transfer: Min guard;Ambulation;BSC;RW             General ADL Comments: sister will assist with adls.  Brother and husband also will help at home.  She has a long sponge and reacher at home:  educated on uses for adls.  Pt assisted her dad with tub bench in the past.  educated on use and keeping KI on until seated on bench. Reviewed precautions and sidestepping through tight spaces for safety     Vision     Perception     Praxis      Pertinent Vitals/Pain Pain Score: 7  Pain Location: R knee Pain Descriptors / Indicators: Aching Pain Intervention(s): Limited activity within patient's tolerance;Monitored during session;Premedicated before session;Repositioned;Heat applied     Hand Dominance     Extremity/Trunk Assessment Upper Extremity  Assessment Upper Extremity Assessment: Overall WFL for tasks assessed           Communication Communication Communication: No difficulties   Cognition Arousal/Alertness: Awake/alert Behavior During Therapy: WFL for tasks assessed/performed Overall Cognitive Status: Within Functional Limits for tasks assessed                     General Comments       Exercises       Shoulder Instructions      Home Living Family/patient expects to be discharged to:: Private residence Living Arrangements: Other relatives;Parent;Spouse/significant other Available Help at Discharge: Family               Bathroom Shower/Tub: Tub/shower unit Shower/tub characteristics: Curtain Firefighter: Handicapped height     Home Equipment: Bedside commode;Tub bench          Prior Functioning/Environment Level of Independence: Independent                 OT Problem List:     OT Treatment/Interventions:      OT Goals(Current goals can be found in the care plan section) Acute  Rehab OT Goals Patient Stated Goal: to walk, get the orther knee fixed OT Goal Formulation: All assessment and education complete, DC therapy  OT Frequency:     Barriers to D/C:            Co-evaluation              End of Session    Activity Tolerance: Patient tolerated treatment well Patient left: in chair;with call bell/phone within reach;with family/visitor present   Time: 1002-1026 OT Time Calculation (min): 24 min Charges:  OT General Charges $OT Visit: 1 Procedure OT Evaluation $OT Eval Low Complexity: 1 Procedure G-Codes:    Naturi Alarid 26-Jan-2017, 10:31 AM Marica Otter, OTR/L [phone] Jan 26, 2017

## 2017-01-15 LAB — BASIC METABOLIC PANEL
Anion gap: 8 (ref 5–15)
BUN: 10 mg/dL (ref 6–20)
CO2: 28 mmol/L (ref 22–32)
CREATININE: 0.63 mg/dL (ref 0.44–1.00)
Calcium: 8.4 mg/dL — ABNORMAL LOW (ref 8.9–10.3)
Chloride: 101 mmol/L (ref 101–111)
GFR calc Af Amer: >60 mL/min (ref >60)
GFR calc non Af Amer: >60 mL/min (ref >60)
GLUCOSE: 144 mg/dL — AB (ref 65–99)
Potassium: 3.4 mmol/L — ABNORMAL LOW (ref 3.5–5.1)
SODIUM: 137 mmol/L (ref 135–145)

## 2017-01-15 LAB — CBC
HCT: 30.6 % — ABNORMAL LOW (ref 36.0–46.0)
HEMOGLOBIN: 10 g/dL — AB (ref 12.0–15.0)
MCH: 28 pg (ref 26.0–34.0)
MCHC: 32.7 g/dL (ref 30.0–36.0)
MCV: 85.7 fL (ref 78.0–100.0)
PLATELETS: 157 10*3/uL (ref 150–400)
RBC: 3.57 MIL/uL — AB (ref 3.87–5.11)
RDW: 13 % (ref 11.5–15.5)
WBC: 12.5 10*3/uL — AB (ref 4.0–10.5)

## 2017-01-15 LAB — GLUCOSE, CAPILLARY
GLUCOSE-CAPILLARY: 132 mg/dL — AB (ref 65–99)
GLUCOSE-CAPILLARY: 174 mg/dL — AB (ref 65–99)
Glucose-Capillary: 198 mg/dL — ABNORMAL HIGH (ref 65–99)

## 2017-01-15 MED ORDER — POTASSIUM CHLORIDE CRYS ER 20 MEQ PO TBCR
40.0000 meq | EXTENDED_RELEASE_TABLET | Freq: Every day | ORAL | Status: DC
  Administered 2017-01-15: 40 meq via ORAL
  Filled 2017-01-15: qty 2

## 2017-01-15 MED ORDER — HYDROMORPHONE HCL 2 MG PO TABS
2.0000 mg | ORAL_TABLET | ORAL | Status: DC | PRN
  Administered 2017-01-15 (×2): 2 mg via ORAL
  Filled 2017-01-15 (×2): qty 1

## 2017-01-15 MED ORDER — RIVAROXABAN 10 MG PO TABS: 10.0000 mg | ORAL_TABLET | Freq: Every day | ORAL | 0 refills | Status: DC

## 2017-01-15 MED ORDER — METHOCARBAMOL 500 MG PO TABS: 500.0000 mg | ORAL_TABLET | Freq: Four times a day (QID) | ORAL | 0 refills | Status: DC | PRN

## 2017-01-15 MED ORDER — HYDROMORPHONE HCL 2 MG PO TABS: 2.0000 mg | ORAL_TABLET | ORAL | 0 refills | Status: DC | PRN

## 2017-01-15 NOTE — Progress Notes (Signed)
Patient complains of increased pain right knee, D perkins notified. D Anne Ng

## 2017-01-15 NOTE — Progress Notes (Signed)
   Subjective: 2 Days Post-Op Procedure(s) (LRB): RIGHT TOTAL KNEE ARTHROPLASTY (Right) Patient reports pain as mild.   Patient seen in rounds with Dr. Lequita Halt. Patient is well, and has had no acute complaints or problems Patient is ready to go home  Objective: Vital signs in last 24 hours: Temp:  [97.4 F (36.3 C)-98.2 F (36.8 C)] 98.2 F (36.8 C) (02/07 0639) Pulse Rate:  [59-67] 64 (02/07 0639) Resp:  [16-18] 16 (02/07 0639) BP: (103-130)/(51-86) 115/86 (02/07 0639) SpO2:  [95 %-98 %] 96 % (02/07 0639)  Intake/Output from previous day:  Intake/Output Summary (Last 24 hours) at 01/15/17 0731 Last data filed at 01/15/17 0640  Gross per 24 hour  Intake             1200 ml  Output             1750 ml  Net             -550 ml    Intake/Output this shift: No intake/output data recorded.  Labs:  Recent Labs  01/14/17 0441 01/15/17 0424  HGB 10.8* 10.0*    Recent Labs  01/14/17 0441 01/15/17 0424  WBC 12.6* 12.5*  RBC 3.65* 3.57*  HCT 32.0* 30.6*  PLT 164 157    Recent Labs  01/14/17 0441 01/15/17 0424  NA 139 137  K 4.4 3.4*  CL 104 101  CO2 29 28  BUN 11 10  CREATININE 0.67 0.63  GLUCOSE 149* 144*  CALCIUM 8.7* 8.4*   No results for input(s): LABPT, INR in the last 72 hours.  EXAM: General - Patient is Alert and Appropriate Extremity - Neurovascular intact Sensation intact distally Incision - clean, dry Motor Function - intact, moving foot and toes well on exam.   Assessment/Plan: 2 Days Post-Op Procedure(s) (LRB): RIGHT TOTAL KNEE ARTHROPLASTY (Right) Procedure(s) (LRB): RIGHT TOTAL KNEE ARTHROPLASTY (Right) Past Medical History:  Diagnosis Date  . Arthritis   . Diabetes mellitus without complication ([facility])   . PONV (postoperative nausea and vomiting)   . Pulmonary embolism ([facility]) 2005   Principal Problem:   OA (osteoarthritis) of knee  Estimated body mass index is 26.61 kg/m as calculated from the following:   Height as of this  encounter: 5\' 8"  (1.727 m).   Weight as of this encounter: 79.4 kg (175 lb). Discharge home with home health Diet - Diabetic diet Follow up - in 2 weeks Activity - WBAT Disposition - Home Condition Upon Discharge - improving D/C Meds - See DC Summary DVT Prophylaxis - Xarelto  , PA-C Orthopaedic Surgery 01/15/2017, 7:31 AM

## 2017-01-15 NOTE — Progress Notes (Signed)
Physical Therapy Treatment Patient Details Name: [name] MRN: [id number] DOB: 06-02-1956 Today's Date: 01/15/2017    History of Present Illness R TKA    PT Comments    POD # 2 am session Pt looking rough.  Max c/o feeling "weak" and "tiered".  Limited activity tolerance and amb distance.  Practiced stairs required + 2 assist then recliner needed to be brought to pt as she was unable to tolerate further activity.  Will allow pt to rest and eat some, then return to complete PT activity.  Follow Up Recommendations  Home health PT;Supervision/Assistance - 24 hour     Equipment Recommendations  None recommended by PT    Recommendations for Other Services       Precautions / Restrictions Precautions Precautions: Knee Precaution Comments: instructed on KI use for long walks and stairs Required Braces or Orthoses: Knee Immobilizer - Right Knee Immobilizer - Right: Discontinue once straight leg raise with < 10 degree lag Restrictions Weight Bearing Restrictions: No Other Position/Activity Restrictions: WBAT    Mobility  Bed Mobility Overal bed mobility: Needs Assistance Bed Mobility: Supine to Sit     Supine to sit: Min assist     General bed mobility comments: assist for R LE OOB with increased time  Transfers Overall transfer level: Needs assistance Equipment used: Rolling walker (2 wheeled) Transfers: Sit to/from Stand Sit to Stand: Supervision;Min guard         General transfer comment: 25% cues for UE/LE placement and increased time  Ambulation/Gait Ambulation/Gait assistance: Min assist Ambulation Distance (Feet): 12 Feet Assistive device: Rolling walker (2 wheeled) Gait Pattern/deviations: Step-to pattern;Decreased stance time - right Gait velocity: decreased   General Gait Details: very limited activity tolerance with MAX c/o feeling "weak", "tiered" and "ill".    Stairs Stairs: Yes   Stair Management: No rails;Step to pattern;Backwards;With  walker Number of Stairs: 2 General stair comments: 50% VC's on proper tech up backward due to no rails.   Instructed to wear KI for increased support.  Instructed to have 2 people assist her )one in front/one in back)  Wheelchair Mobility    Modified Rankin (Stroke Patients Only)       Balance                                    Cognition Arousal/Alertness: Awake/alert Behavior During Therapy: WFL for tasks assessed/performed Overall Cognitive Status: Within Functional Limits for tasks assessed                      Exercises      General Comments        Pertinent Vitals/Pain Pain Assessment: 0-10 Pain Score: 4  Pain Location: R knee Pain Descriptors / Indicators: Grimacing;Operative site guarding;Discomfort;Sore Pain Intervention(s): Monitored during session;Repositioned;Ice applied    Home Living                      Prior Function            PT Goals (current goals can now be found in the care plan section) Progress towards PT goals: Progressing toward goals    Frequency    7X/week      PT Plan Current plan remains appropriate    Co-evaluation             End of Session Equipment Utilized During Treatment: Right knee immobilizer Activity Tolerance:  Patient limited by fatigue;Patient limited by pain Patient left: in chair;with family/visitor present;with call bell/phone within reach     Time: 1125-1150 PT Time Calculation (min) (ACUTE ONLY): 25 min  Charges:  $Gait Training: 8-22 mins $Therapeutic Activity: 8-22 mins                    G Codes:      Felecia Shelling  PTA [facility]  Acute  Rehab Pager      [phone]

## 2017-01-15 NOTE — Progress Notes (Signed)
Physical Therapy Treatment Patient Details Name: [name] MRN: [id number] DOB: 02/02/1956 Today's Date: 01/15/2017    History of Present Illness R TKA    PT Comments    POD # 2 pm session Pain meds changed - much improved pm session. Brother present and instructed on all below mobility and stairs.  Min pain and no c/o feeling "bad". Pt has met mobility goals to D/C to home today   Follow Up Recommendations  Home health PT;Supervision/Assistance - 24 hour     Equipment Recommendations  None recommended by PT    Recommendations for Other Services       Precautions / Restrictions Precautions Precautions: Knee Precaution Comments: instructed on KI use for long walks and stairs Required Braces or Orthoses: Knee Immobilizer - Right Knee Immobilizer - Right: Discontinue once straight leg raise with < 10 degree lag Restrictions Weight Bearing Restrictions: No Other Position/Activity Restrictions: WBAT    Mobility  Bed Mobility Overal bed mobility: Needs Assistance Bed Mobility: Supine to Sit;Sit to Supine     Supine to sit: Min assist Sit to supine: Min assist   General bed mobility comments: assist for R LE OOB with increased time  Transfers Overall transfer level: Needs assistance Equipment used: Rolling walker (2 wheeled) Transfers: Sit to/from Stand;Stand Pivot Transfers Sit to Stand: Supervision;Min guard Stand pivot transfers: Supervision;Min guard       General transfer comment: 25% cues for UE/LE placement and increased time    also assisted on/off toilet  Ambulation/Gait Ambulation/Gait assistance: Supervision;Min guard Ambulation Distance (Feet): 52 Feet Assistive device: Rolling walker (2 wheeled) Gait Pattern/deviations: Step-to pattern;Decreased stance time - right Gait velocity: decreased   General Gait Details: much improved with different pain meds on board.  Tolerated an increased distance and no c/o feeling "bad" plus decreased  pain   Stairs Stairs: Yes   Stair Management: No rails;Step to pattern;Backwards;With walker Number of Stairs: 2 General stair comments: 25% VC's on proper tech performed with pt and her brother who is taking her home  Wheelchair Mobility    Modified Rankin (Stroke Patients Only)       Balance                                    Cognition Arousal/Alertness: Awake/alert Behavior During Therapy: WFL for tasks assessed/performed Overall Cognitive Status: Within Functional Limits for tasks assessed                      Exercises      General Comments        Pertinent Vitals/Pain Pain Assessment: 0-10 Pain Score: 2  Pain Location: R knee Pain Descriptors / Indicators: Grimacing;Operative site guarding;Discomfort;Sore Pain Intervention(s): Monitored during session;Repositioned;Ice applied;Premedicated before session    Home Living                      Prior Function            PT Goals (current goals can now be found in the care plan section) Progress towards PT goals: Progressing toward goals    Frequency    7X/week      PT Plan Current plan remains appropriate    Co-evaluation             End of Session Equipment Utilized During Treatment: Right knee immobilizer Activity Tolerance: Patient tolerated treatment well Patient left: in bed;with   call bell/phone within reach;with family/visitor present     Time: 1605-1635 PT Time Calculation (min) (ACUTE ONLY): 30 min  Charges:  $Gait Training: 8-22 mins $Therapeutic Activity: 8-22 mins                    G Codes:      Lori Kropski  PTA [facility]  Acute  Rehab Pager      [phone]  

## 2017-07-30 NOTE — Addendum Note (Signed)
Addendum  created 07/30/17 1142 by Achille Rich, MD   Sign clinical note

## 2021-01-09 ENCOUNTER — Other Ambulatory Visit: Payer: Self-pay | Admitting: Family Medicine

## 2021-01-09 DIAGNOSIS — R102 Pelvic and perineal pain: Secondary | ICD-10-CM

## 2021-01-16 ENCOUNTER — Ambulatory Visit (HOSPITAL_COMMUNITY)
Admission: RE | Admit: 2021-01-16 | Discharge: 2021-01-16 | Disposition: A | Discharge status: Home / Self Care | Payer: 59 | Source: Ambulatory Visit | Attending: Family Medicine | Admitting: Family Medicine

## 2021-01-16 ENCOUNTER — Other Ambulatory Visit: Payer: Self-pay

## 2021-01-16 DIAGNOSIS — R102 Pelvic and perineal pain: Secondary | ICD-10-CM | POA: Insufficient documentation

## 2021-02-26 ENCOUNTER — Other Ambulatory Visit (HOSPITAL_COMMUNITY): Payer: Self-pay | Admitting: Family Medicine

## 2021-02-26 ENCOUNTER — Ambulatory Visit (HOSPITAL_COMMUNITY)
Admission: RE | Admit: 2021-02-26 | Discharge: 2021-02-26 | Disposition: A | Discharge status: Home / Self Care | Payer: 59 | Source: Ambulatory Visit | Attending: Family Medicine | Admitting: Family Medicine

## 2021-02-26 ENCOUNTER — Other Ambulatory Visit: Payer: Self-pay

## 2021-02-26 DIAGNOSIS — M25551 Pain in right hip: Secondary | ICD-10-CM | POA: Insufficient documentation

## 2021-02-26 DIAGNOSIS — M25562 Pain in left knee: Secondary | ICD-10-CM

## 2021-02-26 DIAGNOSIS — M25552 Pain in left hip: Secondary | ICD-10-CM | POA: Diagnosis present

## 2021-11-26 ENCOUNTER — Other Ambulatory Visit (HOSPITAL_COMMUNITY): Payer: Self-pay | Admitting: Family Medicine

## 2021-11-26 ENCOUNTER — Other Ambulatory Visit: Payer: Self-pay

## 2021-11-26 ENCOUNTER — Ambulatory Visit (HOSPITAL_COMMUNITY)
Admission: RE | Admit: 2021-11-26 | Discharge: 2021-11-26 | Disposition: A | Discharge status: Home / Self Care | Payer: 59 | Source: Ambulatory Visit | Attending: Family Medicine | Admitting: Family Medicine

## 2021-11-26 DIAGNOSIS — S7292XA Unspecified fracture of left femur, initial encounter for closed fracture: Secondary | ICD-10-CM | POA: Diagnosis not present

## 2022-04-09 ENCOUNTER — Ambulatory Visit (HOSPITAL_COMMUNITY)
Admission: RE | Admit: 2022-04-09 | Discharge: 2022-04-09 | Disposition: A | Discharge status: Home / Self Care | Payer: Medicare Other | Source: Ambulatory Visit | Attending: Family Medicine | Admitting: Family Medicine

## 2022-04-09 ENCOUNTER — Other Ambulatory Visit (HOSPITAL_COMMUNITY): Payer: Self-pay | Admitting: Family Medicine

## 2022-04-09 DIAGNOSIS — R5383 Other fatigue: Secondary | ICD-10-CM

## 2022-04-17 ENCOUNTER — Other Ambulatory Visit (HOSPITAL_COMMUNITY): Payer: Self-pay | Admitting: Family Medicine

## 2022-04-17 DIAGNOSIS — R0602 Shortness of breath: Secondary | ICD-10-CM

## 2022-04-17 DIAGNOSIS — E1369 Other specified diabetes mellitus with other specified complication: Secondary | ICD-10-CM

## 2022-04-17 DIAGNOSIS — R5383 Other fatigue: Secondary | ICD-10-CM

## 2022-04-26 ENCOUNTER — Ambulatory Visit (HOSPITAL_COMMUNITY)
Admission: RE | Admit: 2022-04-26 | Discharge: 2022-04-26 | Disposition: A | Discharge status: Home / Self Care | Payer: Medicare Other | Source: Ambulatory Visit | Attending: Family Medicine | Admitting: Family Medicine

## 2022-04-26 DIAGNOSIS — R0602 Shortness of breath: Secondary | ICD-10-CM | POA: Insufficient documentation

## 2022-04-26 LAB — ECHOCARDIOGRAM COMPLETE
Area-P 1/2: 2.8 cm2
S' Lateral: 3.1 cm

## 2022-04-26 NOTE — Progress Notes (Signed)
*  PRELIMINARY RESULTS* Echocardiogram 2D Echocardiogram has been performed.  Stacey Drain 04/26/2022, 1:41 PM

## 2022-12-23 IMAGING — DX DG CHEST 2V
2 series · 2 of 2 positions shown · non-contrast
Comparison: Remote radiograph 02/15/2013, remote CT 12/19/2004

CLINICAL DATA: Fatigue. Patient reports intermittent shortness of
breath.

EXAM:
CHEST - 2 VIEW

[chest pa]
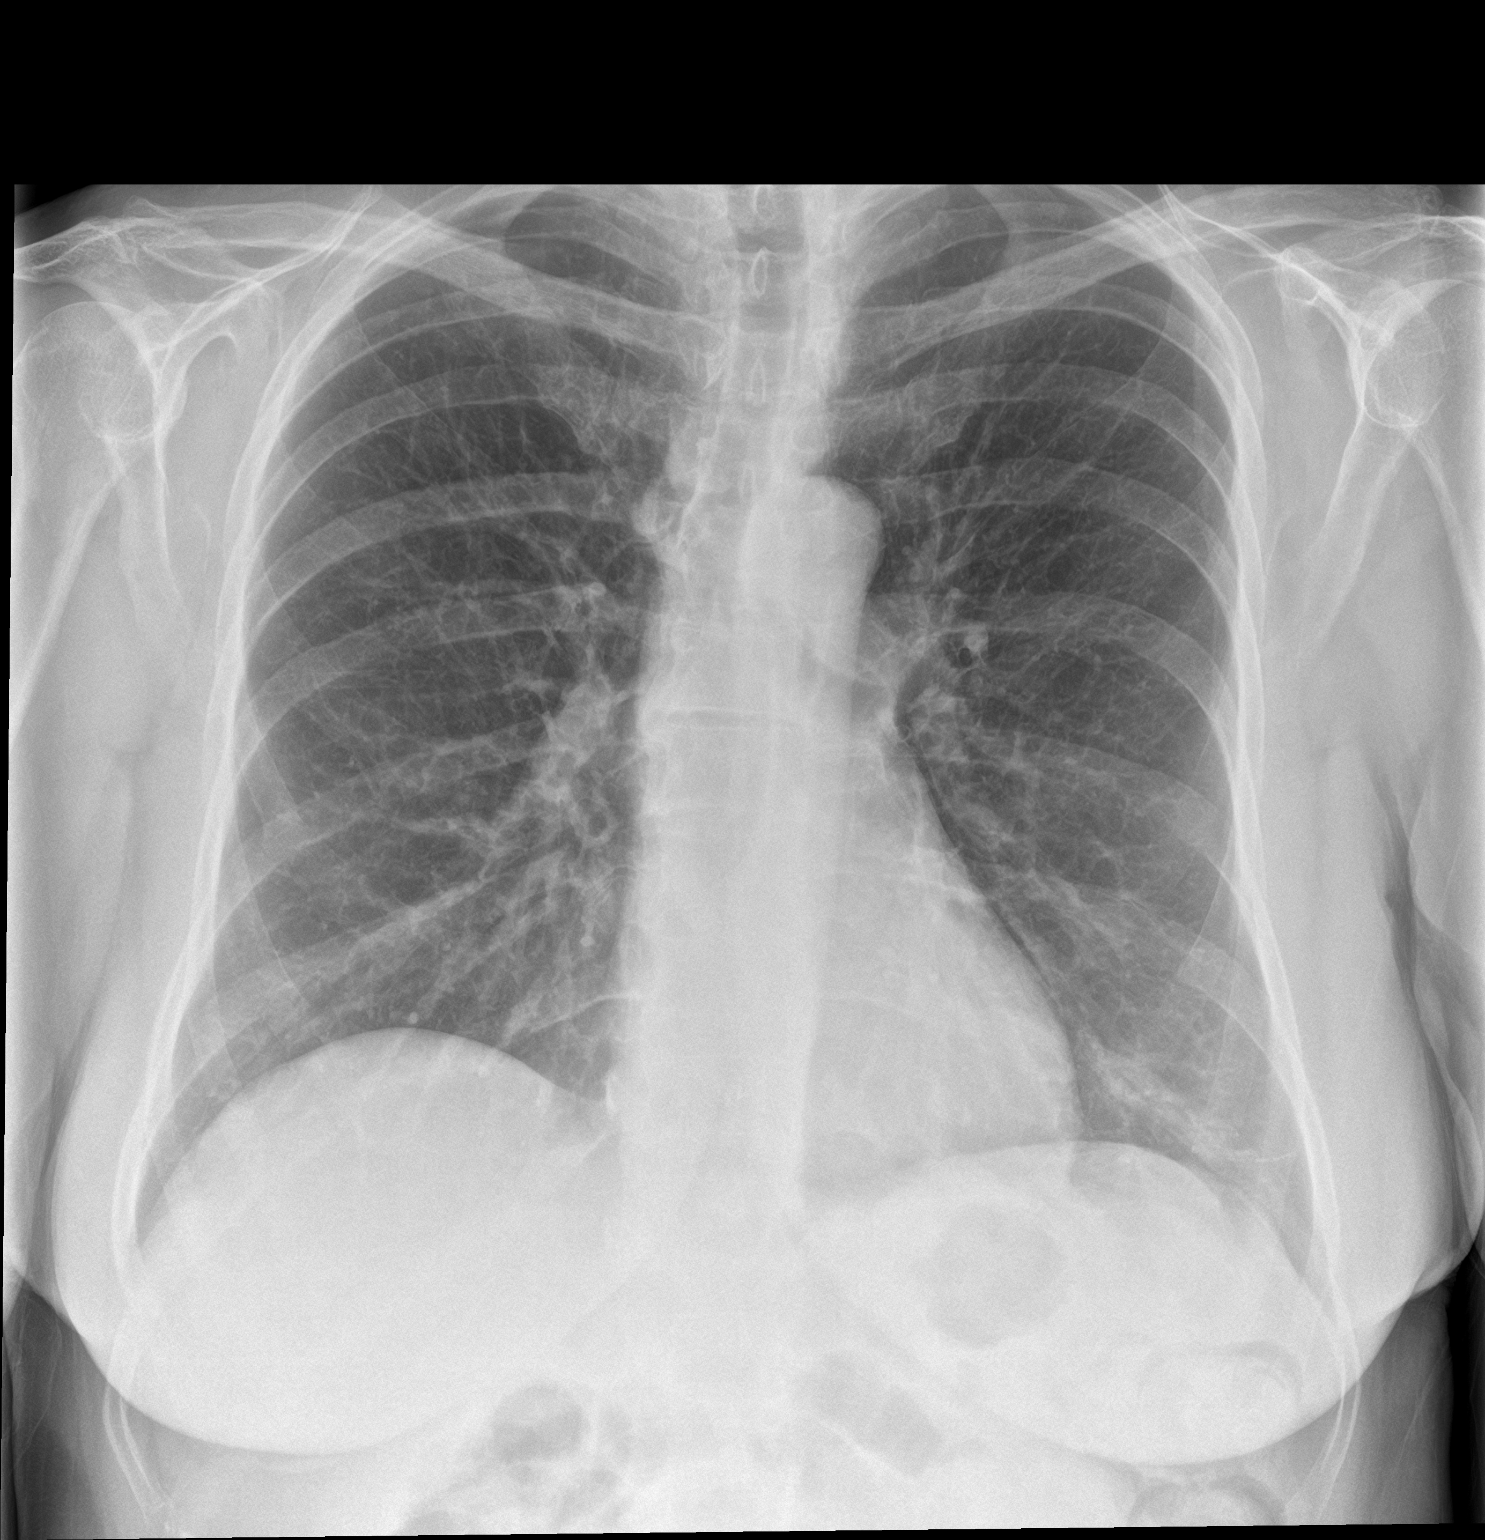

[chest lat]
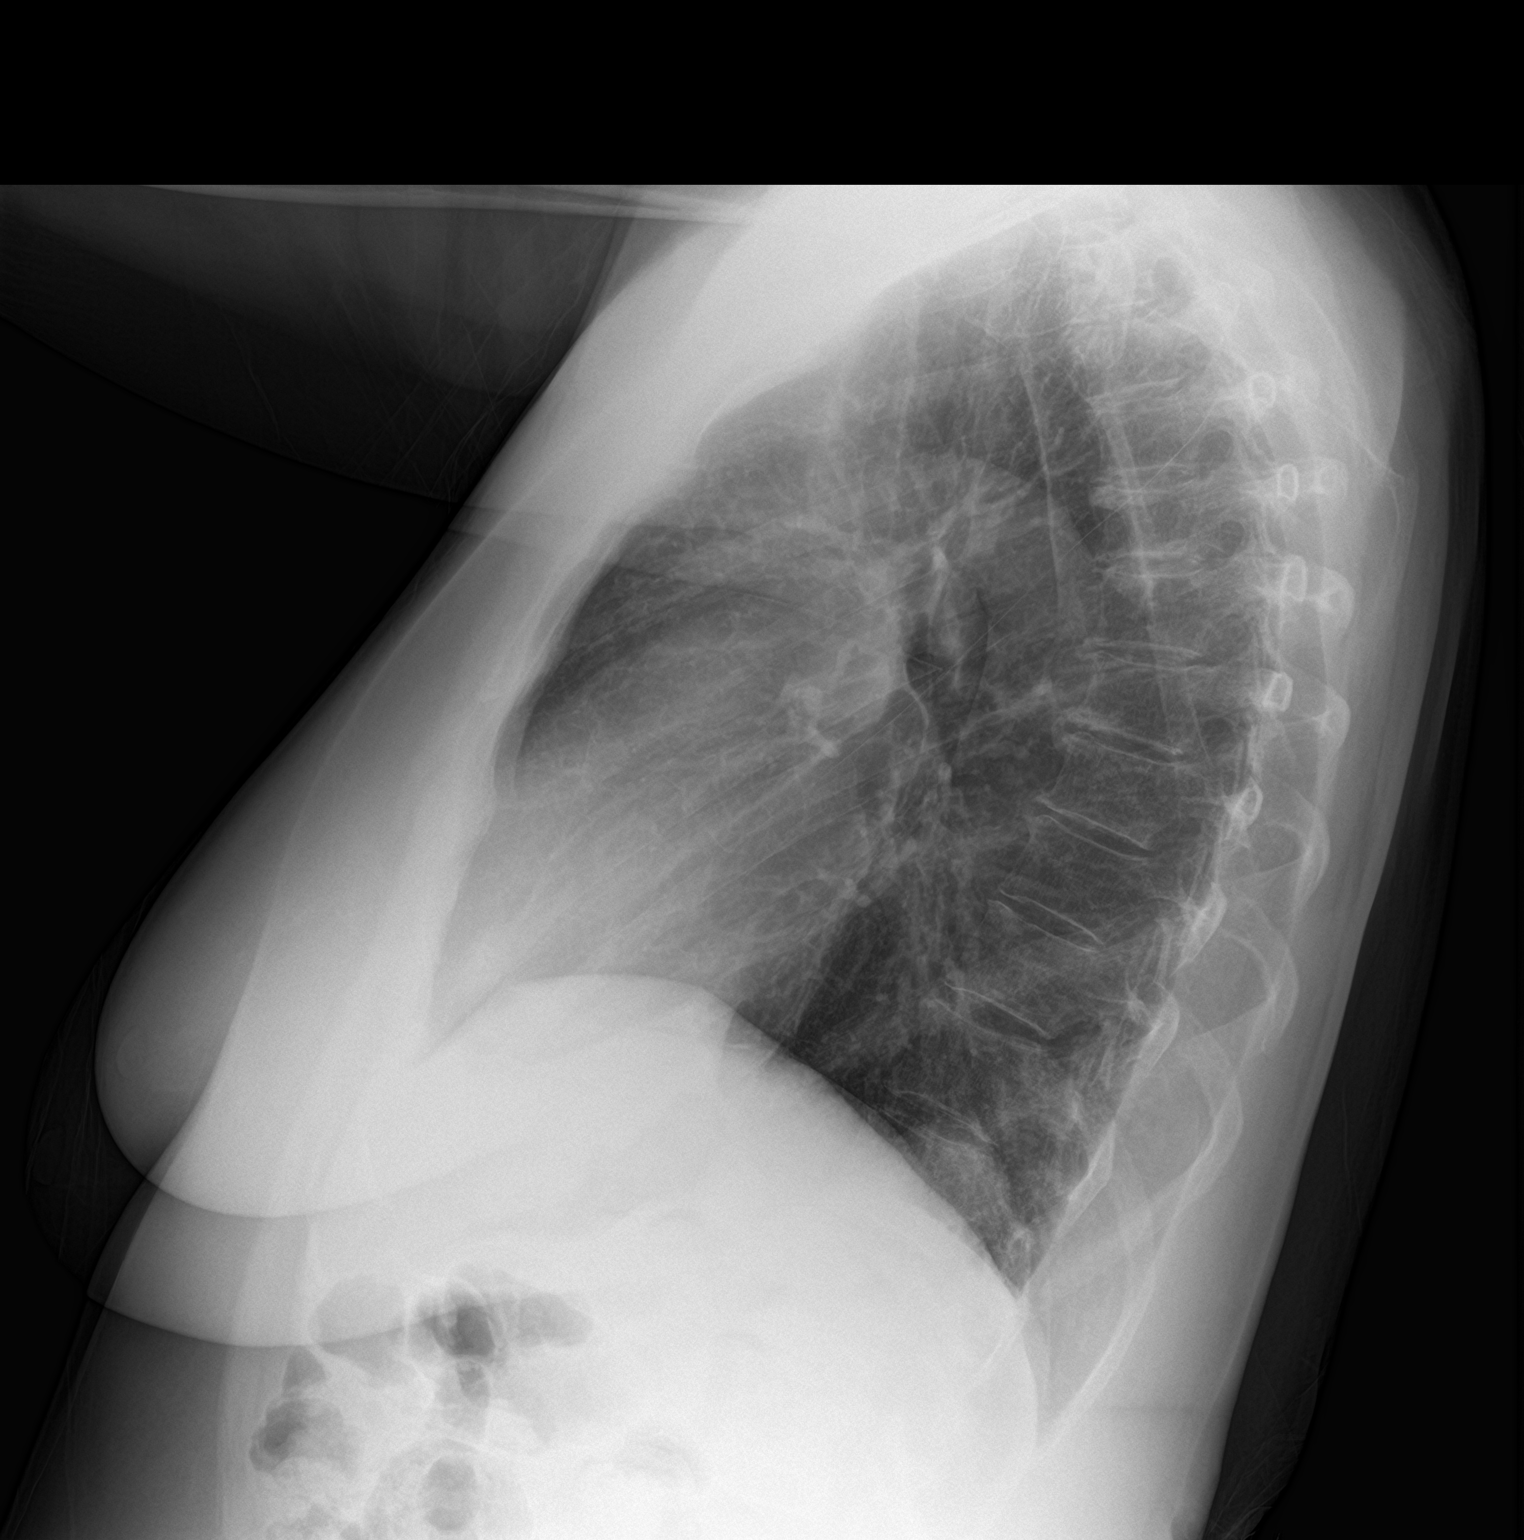

[2 of 2 positions shown; findings below may reference images not displayed]

FINDINGS: The heart is normal in size. Normal mediastinal contours. Slight
peribronchial thickening. Subsegmental opacity at the left lung
base, likely atelectasis. Rounded basilar opacity on the lateral
view corresponds to a left Bochdalek's hernia on remote chest CT. No
confluent consolidation. No pulmonary edema. No pneumothorax or
pleural effusion. No acute osseous findings.
IMPRESSION: 1. Mild peribronchial thickening, can be seen with bronchitis or
asthma.
2. Subsegmental left basilar atelectasis.

## 2023-01-03 ENCOUNTER — Emergency Department (HOSPITAL_COMMUNITY)
Admission: EM | Admit: 2023-01-03 | Discharge: 2023-01-03 | Disposition: A | Discharge status: Home / Self Care | Payer: Medicare Other | Attending: Emergency Medicine | Admitting: Emergency Medicine

## 2023-01-03 ENCOUNTER — Emergency Department (HOSPITAL_COMMUNITY): Payer: Medicare Other

## 2023-01-03 ENCOUNTER — Encounter (HOSPITAL_COMMUNITY): Payer: Self-pay | Admitting: Emergency Medicine

## 2023-01-03 ENCOUNTER — Other Ambulatory Visit: Payer: Self-pay

## 2023-01-03 DIAGNOSIS — Z1152 Encounter for screening for COVID-19: Secondary | ICD-10-CM | POA: Insufficient documentation

## 2023-01-03 DIAGNOSIS — R0602 Shortness of breath: Secondary | ICD-10-CM | POA: Diagnosis not present

## 2023-01-03 DIAGNOSIS — R0789 Other chest pain: Secondary | ICD-10-CM | POA: Insufficient documentation

## 2023-01-03 DIAGNOSIS — R079 Chest pain, unspecified: Secondary | ICD-10-CM

## 2023-01-03 LAB — CBC
HCT: 38.8 % (ref 36.0–46.0)
Hemoglobin: 12.7 g/dL (ref 12.0–15.0)
MCH: 29.6 pg (ref 26.0–34.0)
MCHC: 32.7 g/dL (ref 30.0–36.0)
MCV: 90.4 fL (ref 80.0–100.0)
Platelets: 204 10*3/uL (ref 150–400)
RBC: 4.29 MIL/uL (ref 3.87–5.11)
RDW: 12.6 % (ref 11.5–15.5)
WBC: 7.8 10*3/uL (ref 4.0–10.5)
nRBC: 0 % (ref 0.0–0.2)

## 2023-01-03 LAB — BASIC METABOLIC PANEL
Anion gap: 9 (ref 5–15)
BUN: 11 mg/dL (ref 8–23)
CO2: 25 mmol/L (ref 22–32)
Calcium: 9 mg/dL (ref 8.9–10.3)
Chloride: 105 mmol/L (ref 98–111)
Creatinine, Ser: 0.66 mg/dL (ref 0.44–1.00)
GFR, Estimated: >60 mL/min (ref >60)
Glucose, Bld: 147 mg/dL — ABNORMAL HIGH (ref 70–99)
Potassium: 3.8 mmol/L (ref 3.5–5.1)
Sodium: 139 mmol/L (ref 135–145)

## 2023-01-03 LAB — RESP PANEL BY RT-PCR (RSV, FLU A&B, COVID)  RVPGX2
Influenza A by PCR: NEGATIVE
Influenza B by PCR: NEGATIVE
Resp Syncytial Virus by PCR: NEGATIVE
SARS Coronavirus 2 by RT PCR: NEGATIVE

## 2023-01-03 LAB — CBG MONITORING, ED: Glucose-Capillary: 142 mg/dL — ABNORMAL HIGH (ref 70–99)

## 2023-01-03 LAB — TROPONIN I (HIGH SENSITIVITY)
Troponin I (High Sensitivity): 3 ng/L (ref <18)
Troponin I (High Sensitivity): <2 ng/L (ref <18)

## 2023-01-03 LAB — D-DIMER, QUANTITATIVE: D-Dimer, Quant: 0.45 ug/mL-FEU (ref 0.00–0.50)

## 2023-01-03 LAB — BRAIN NATRIURETIC PEPTIDE: B Natriuretic Peptide: 51 pg/mL (ref 0.0–100.0)

## 2023-01-03 NOTE — ED Triage Notes (Addendum)
Pt to ED from PCP c/o SHOB x 4 days, reports worse with exertion, reports intermittent mild chest pain

## 2023-01-03 NOTE — ED Provider Notes (Signed)
[place] EMERGENCY DEPARTMENT AT [facility] Provider Note   CSN: [id number] Arrival date & time: 01/03/23  1331     History  Chief Complaint  Patient presents with   Shortness of Breath    [name] is a 67 y.o. female with hx of DMT2, prior PE, and osteoarthritis presenting to ED with chief complaint of worsening shortness of breath.  Sent by PCP for possible CHF workup.  Worse with exertion, sometimes accompanied with intermittent chest pain/discomfort.  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  Worse by end of day.  Chest discomfort described as random, intermittent, and dull.    Reports receiving bilateral shoulder injections about 4 weeks ago and history of blood clot around 35 years ago.  The history is provided by the patient and medical records.  Shortness of Breath      Home Medications Prior to Admission medications   Medication Sig Start Date End Date Taking? Authorizing Provider  albuterol (PROVENTIL HFA;VENTOLIN HFA) 108 (90 Base) MCG/ACT inhaler Inhale 2 puffs into the lungs every 6 (six) hours as needed for wheezing or shortness of breath.    [provider]  cycloSPORINE (RESTASIS) 0.05 % ophthalmic emulsion Place 1 drop into both eyes 2 (two) times daily.    [provider]  escitalopram (LEXAPRO) 20 MG tablet Take 10 mg by mouth at bedtime.     [provider]  Eszopiclone 3 MG TABS Take 3 mg by mouth at bedtime. Take immediately before bedtime    [provider]  fluticasone (FLONASE) 50 MCG/ACT nasal spray Place 1 spray into both nostrils daily as needed for allergies.  05/09/15   [provider]  HYDROmorphone (DILAUDID) 2 MG tablet Take 1-2 tablets (2-4 mg total) by mouth every 4 (four) hours as needed for moderate pain or severe pain. 01/15/17   Perkins, Alexzandrew L, PA-C  lovastatin (MEVACOR) 20 MG tablet Take 20 mg by mouth at  bedtime.    [provider]  metformin (FORTAMET) 1000 MG (OSM) 24 hr tablet Take 1,000 mg by mouth daily with supper. 10/02/16   [provider]  methocarbamol (ROBAXIN) 500 MG tablet Take 1 tablet (500 mg total) by mouth every 6 (six) hours as needed for muscle spasms. 01/15/17   Perkins, Alexzandrew L, PA-C  pregabalin (LYRICA) 75 MG capsule Take 75 mg by mouth 3 (three) times daily.    [provider]  rivaroxaban (XARELTO) 10 MG TABS tablet Take 1 tablet (10 mg total) by mouth daily with breakfast. Take Xarelto for two and a half more weeks following discharge from the hospital, then discontinue Xarelto. Once the patient has completed the blood thinner regimen, then take a Baby 81 mg Aspirin daily for three more weeks. 01/15/17   Perkins, Alexzandrew L, PA-C      Allergies    Aspirin    Review of Systems   Review of Systems  Respiratory:  Positive for shortness of breath.     Physical Exam Updated Vital Signs BP 130/72 (BP Location: Right Arm)   Pulse 74   Temp 97.8 F (36.6 C) (Oral)   Resp 18   Ht 5\' 8"  (1.727 m)   Wt 71 kg   SpO2 100%   BMI 23.79 kg/m  Physical Exam Vitals and nursing note reviewed.  Constitutional:      General: She is not in acute distress.    Appearance:  She is well-developed. She is not ill-appearing, toxic-appearing or diaphoretic.  HENT:     Head: Normocephalic and atraumatic.     Mouth/Throat:     Mouth: Mucous membranes are moist.     Pharynx: Oropharynx is clear. No pharyngeal swelling or oropharyngeal exudate.  Eyes:     Conjunctiva/sclera: Conjunctivae normal.  Neck:     Vascular: No JVD.  Cardiovascular:     Rate and Rhythm: Normal rate and regular rhythm.     Pulses: Normal pulses.     Heart sounds: Normal heart sounds. No murmur heard. Pulmonary:     Effort: Pulmonary effort is normal. No tachypnea, accessory muscle usage or respiratory distress.     Breath sounds: Normal breath sounds.     Comments: CTAB,  able to communicate well without difficulty, without increased respiratory effort, equal chest rise, adequate air movement. Chest:     Chest wall: No mass, deformity, tenderness or crepitus.  Abdominal:     Palpations: Abdomen is soft.     Tenderness: There is no abdominal tenderness.  Musculoskeletal:        General: No swelling.     Cervical back: Neck supple.     Right lower leg: No tenderness.     Left lower leg: No tenderness.     Comments: Mild 1+ bilateral, nonpitting edema bilateral lower extremities.  Without erythema, tenderness, warmth, or superficial visible veins.  Skin:    General: Skin is warm and dry.     Capillary Refill: Capillary refill takes less than 2 seconds.     Coloration: Skin is not cyanotic or pale.  Neurological:     Mental Status: She is alert and oriented to person, place, and time.  Psychiatric:        Mood and Affect: Mood normal.     ED Results / Procedures / Treatments   Labs (all labs ordered are listed, but only abnormal results are displayed) Labs Reviewed  CBG MONITORING, ED - Abnormal; Notable for the following components:      Result Value   Glucose-Capillary 142 (*)    All other components within normal limits  RESP PANEL BY RT-PCR (RSV, FLU A&B, COVID)  [id number]  BASIC METABOLIC PANEL  CBC  TROPONIN I (HIGH SENSITIVITY)    EKG None  Radiology No results found.  Procedures Procedures    Medications Ordered in ED Medications - No data to display  ED Course/ Medical Decision Making/ A&P Clinical Course as of 01/03/23 1847  Caleen Essex Jan 03, 2023  1823 D-Dimer, Quant: 0.45 Negative [AC]    Clinical Course User Index [AC] Cecil Cobbs, PA-C                             Medical Decision Making Amount and/or Complexity of Data Reviewed Labs: ordered. Decision-making details documented in ED Course. Radiology: ordered.   67 y.o. female presents to the ED for concern of Shortness of Breath   This involves an  extensive number of treatment options, and is a complaint that carries with it a high risk of complications and morbidity.  The emergent differential diagnosis prior to evaluation includes, but is not limited to:  ACS, pneumonia, pneumothorax, pulmonary embolism, pericarditis/myocarditis, GERD, PUD, musculoskeletal, arrhythmia, anemia  This is not an exhaustive differential.   Past Medical History / Co-morbidities / Social History: Hx of DMT2, arthritis, prior PE (around 30-35 years ago) Social Determinants of Health include: Geriatric  Additional History:  Obtained by chart review.  Notably echo from 04/26/2022 reviewed, which indicated LVEF of 55 to 60% without evidence of aortic regurgitation, or aortic stenosis  Lab Tests: I ordered, and personally interpreted labs.  The pertinent results include:   CBG 142 COVID, flu, RSV negative Troponin 3, <2 BNP 51.0 BMP without significant electrolyte derangement or AKI CBC shows WBC 7.8, no anemia  Imaging Studies: I ordered imaging studies including CXR.   I independently visualized and interpreted imaging which showed negative for acute cardiopulmonary pathology such as pneumonia, pneumothorax, cardiomegaly, pleural effusion.  I agree with the radiologist interpretation. I personally ordered and interpreted EKG 12 lead findings, which showed: NSR  ED Course: Presenting with worsening dyspnea on exertion over the last week.  Recent hx of bilateral shoulder injections 4 weeks ago.  No prior Hx of CHF or MI.  Reports significant decrease in ability to perform daily tasks due to shortness of breath with exertion.  Occasionally accompanied by random chest discomfort, short-lived, nonradiating, dull, and relieves on its own.  No active chest discomfort.  Sent by PCP for possible CHF workup.  Hx prior PE about 30 years ago.  Not on anticoagulation. Pt overall well-appearing on exam.  Nontoxic, nonseptic appearing.  Satting at 100% on room air, without  evidence of respiratory distress.  Not tachycardic, hemodynamically stable.  EKG without evidence of significant ST elevation or depression, arrhythmia, or abnormal intervals.  Mild 1+ nonpitting edema of left lower extremity and minimal of right lower extremity, without erythema, tenderness, varicose veins, or warmth.  HEART score of 4, moderate risk.  Considered ASA and nitroglycerin, however do not seem appropriate at this time.  PERC positive.  However Well's score 1.5 due to prior PE, low probability.  D-dimer negative.  DVT study negative of LLE.  Labs unremarkable.  Unlikely pneumonia, no cough, no leukocytosis, no fevers, CXR and exam without acute findings.  Unlikely pneumothorax, no findings on CXR.  Unlikely pericarditis/myocarditis, GERD, PUD, as does not fit clinical picture.  Chest pain not exertional.  No evidence of pleural effusion or pulmonary edema on CXR.  Unlikely dissection, no pulse deficit, no tearing chest pain, no neurologic complaints.  EKG findings indicate without evidence of acute ischemic changes, abnormal intervals, or dysrhythmia.  Troponins negative.  No anemia.   Ambulated well monitoring O2.  Remains consistent at 100% without evidence of respiratory distress for the patient. Overall, I am uncertain the exact etiology of the patient's symptoms.  However, I do not believe she is currently experiencing a medical, surgical, or psychiatric emergency.  Instructed to follow up closely with cardiology.  Internal referral placed.  Strict return precautions discussed.  Patient in NAD and in good condition at time of discharge.  Disposition: After consideration the patient's encounter today, I do not feel today's workup suggests an emergent condition requiring admission or immediate intervention beyond what has been performed at this time.  Safe for discharge; instructed to return immediately for worsening symptoms, change in symptoms or any other concerns.  I have reviewed the  patients home medicines and have made adjustments as needed.  Discussed course of treatment with the patient, whom demonstrated understanding.  Patient in agreement and has no further questions.    I discussed this case with my attending physician Dr. Regenia Skeeter, who agreed with the proposed treatment course and cosigned this note.  Attending physician stated agreement with plan or made changes to plan which were implemented.     This  chart was dictated using voice recognition software.  Despite best efforts to proofread, errors can occur which can change the documentation meaning.         Final Clinical Impression(s) / ED Diagnoses Final diagnoses:  Shortness of breath  Nonspecific chest pain    Rx / DC Orders ED Discharge Orders          Ordered    Ambulatory referral to Cardiology       Comments: If you have not heard from the Cardiology office within the next 72 hours please call [phone].   01/03/23 1847              Prince Rome, PA-C 40/98/11 [id number]    Noemi Chapel, MD 01/16/23 1452

## 2023-01-03 NOTE — ED Notes (Signed)
Pt ambulatory around nurses station, sats remain 100% RA. PA notified.

## 2023-01-03 NOTE — Discharge Instructions (Addendum)
Your caregiver has diagnosed you as having shortness of breath and chest pain that is nonspecific for one problem. This means that after looking at you and examining you and ordering tests (such as blood work, chest x-rays and EKG), your caregiver does not believe that the problem is serious enough to need watching in the hospital. This judgment is often made after testing shows no acute heart attack and you are at low risk for sudden acute heart condition. Shortness of breath and chest pain comes from many different causes.  Seek immediate medical attention if:  You have severe chest pain, especially if the pain is crushing or pressure-like and spreads to the arms, back, neck, or jaw, or if you have sweating, nausea, shortness of breath. This is an emergency. Don't wait to see if the pain will go away. Get medical help at once. Call 911 immediately. Do not drive herself to the hospital.  Your chest pain gets worse and does not go away with rest.  You have an attack of chest pain lasting longer than usual, despite rest and treatment with the medications your caregiver has prescribed  You awaken from sleep with chest pain or shortness of breath.  You feel faint or dizzy  You feel difficulty moving air/sudden severe increase in shortness of breath  You have chest pain not typical of your usual pain for which you originally saw your caregiver.  You must have a repeat evaluation within 48-72 hours for a recheck of your heart.  An internal referral for cardiology has been placed for you for the location here in [place].  You should receive a phone call within 1 to 2 days for a follow-up appointment within 48-72 hours for reevaluation.   Please refrain from heavy activity over the next few days until you are able to follow-up with cardiology for further assessment.  Return to the ED for new or worsening symptoms as discussed.

## 2023-01-08 NOTE — Progress Notes (Signed)
CARDIOLOGY CONSULT NOTE       Patient ID: [name] MRN: [id number] DOB/AGE: 1956/05/04 67 y.o.  Admit date: (Not on file) Referring Physician: Dorise Bullion PA [facility] Primary Physician: Lemmie Evens, MD Primary Cardiologist: New Reason for Consultation: Dyspnea/CHF  Active Problems:   * No active hospital problems. *   HPI:  67 y.o. referred by [facility] PA Dorise Bullion for dyspnea and ? CHF.  Seen there 01/03/23. History of DM-2, PE 35 years ago. 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 He had a stent in July and sees Hochrein   She has also had malaise, weight loss new GERD She is usually a good sleeper but has had some insomnia lately One stepson lives in [place] and sells hydraulics   ROS All other systems reviewed and negative except as noted above  Past Medical History:  Diagnosis Date   Arthritis    Diabetes mellitus without complication (HCC)    PONV (postoperative nausea and vomiting)    Pulmonary embolism ([place]) 2005    No family history on file.  Social History   Socioeconomic History   Marital status: Married    Spouse name: Not on file   Number of children: Not on file   Years of education: Not on file   Highest education level: Not on file  Occupational History   Not on file  Tobacco Use   Smoking status: Never   Smokeless tobacco: Never  Substance and Sexual Activity   Alcohol use: No   Drug use: No   Sexual activity: Not on file  Other Topics  Concern   Not on file  Social History Narrative   Not on file   Social Determinants of Health   Financial Resource Strain: Not on file  Food Insecurity: Not on file  Transportation Needs: Not on file  Physical Activity: Not on file  Stress: Not on file  Social Connections: Not on file  Intimate Partner Violence: Not on file    Past Surgical History:  Procedure Laterality Date   ABDOMINAL HYSTERECTOMY     CARPAL TUNNEL RELEASE Left 2000   CARPAL TUNNEL RELEASE Right 2000   CATARACT EXTRACTION W/PHACO Right 11/21/2014   Procedure: CATARACT EXTRACTION PHACO AND INTRAOCULAR LENS PLACEMENT ([place]);  Surgeon: Tonny Branch, MD;  Location: [facility];  Service: Ophthalmology;  Laterality: Right;  CDE:5.78   TOTAL KNEE ARTHROPLASTY Right 01/13/2017   Procedure: RIGHT TOTAL KNEE ARTHROPLASTY;  Surgeon: Gaynelle Arabian, MD;  Location: [facility];  Service: Orthopedics;  Laterality: Right;  Adductor Block      Current Outpatient Medications:    albuterol (PROVENTIL HFA;VENTOLIN HFA) 108 (90 Base) MCG/ACT inhaler, Inhale 2 puffs into the lungs every 6 (six) hours as needed for wheezing or shortness of breath., Disp: , Rfl:    cycloSPORINE (RESTASIS) 0.05 % ophthalmic emulsion, Place 1 drop into both eyes 2 (two) times daily., Disp: , Rfl:    escitalopram (LEXAPRO) 20 MG tablet, Take 10  mg by mouth at bedtime. , Disp: , Rfl:    Eszopiclone 3 MG TABS, Take 3 mg by mouth at bedtime. Take immediately before bedtime, Disp: , Rfl:    fluticasone (FLONASE) 50 MCG/ACT nasal spray, Place 1 spray into both nostrils daily as needed for allergies. , Disp: , Rfl:    HYDROmorphone (DILAUDID) 2 MG tablet, Take 1-2 tablets (2-4 mg total) by mouth every 4 (four) hours as needed for moderate pain or severe pain., Disp: 84 tablet, Rfl: 0   lovastatin (MEVACOR) 20 MG tablet, Take 20 mg by mouth at bedtime., Disp: , Rfl:    metformin (FORTAMET) 1000 MG (OSM) 24 hr tablet, Take 1,000 mg by mouth daily with supper., Disp: , Rfl:     methocarbamol (ROBAXIN) 500 MG tablet, Take 1 tablet (500 mg total) by mouth every 6 (six) hours as needed for muscle spasms., Disp: 80 tablet, Rfl: 0   pregabalin (LYRICA) 75 MG capsule, Take 75 mg by mouth 3 (three) times daily., Disp: , Rfl:    rivaroxaban (XARELTO) 10 MG TABS tablet, Take 1 tablet (10 mg total) by mouth daily with breakfast. Take Xarelto for two and a half more weeks following discharge from the hospital, then discontinue Xarelto. Once the patient has completed the blood thinner regimen, then take a Baby 81 mg Aspirin daily for three more weeks., Disp: 20 tablet, Rfl: 0    Physical Exam: There were no vitals taken for this visit.    Affect appropriate Healthy:  appears stated age 68: normal Neck supple with no adenopathy JVP normal no bruits no thyromegaly Lungs clear with no wheezing and good diaphragmatic motion Heart:  S1/S2 no murmur, no rub, gallop or click PMI normal Abdomen: benighn, BS positve, no tenderness, no AAA no bruit.  No HSM or HJR Distal pulses intact with no bruits No edema Neuro non-focal Skin warm and dry No muscular weakness   Labs:   Lab Results  Component Value Date   WBC 7.8 01/03/2023   HGB 12.7 01/03/2023   HCT 38.8 01/03/2023   MCV 90.4 01/03/2023   PLT 204 01/03/2023    Recent Labs  Lab 01/03/23 1405  NA 139  K 3.8  CL 105  CO2 25  BUN 11  CREATININE 0.66  CALCIUM 9.0  GLUCOSE 147*   No results found for: "CKTOTAL", "CKMB", "CKMBINDEX", "TROPONINI" No results found for: "CHOL" No results found for: "HDL" No results found for: "LDLCALC" No results found for: "TRIG" No results found for: "CHOLHDL" No results found for: "LDLDIRECT"    Radiology: US Venous Img Lower  Left (DVT Study)  Result Date: 01/03/2023 CLINICAL DATA:  Shortness of breath, leg swelling EXAM: LEFT LOWER EXTREMITY VENOUS DOPPLER ULTRASOUND TECHNIQUE: Gray-scale sonography with compression, as well as color and duplex ultrasound, were  performed to evaluate the deep venous system(s) from the level of the common femoral vein through the popliteal and proximal calf veins. COMPARISON:  None Available. FINDINGS: VENOUS Normal compressibility of the common femoral, superficial femoral, and popliteal veins, as well as the visualized calf veins. Visualized portions of profunda femoral vein and great saphenous vein unremarkable. No filling defects to suggest DVT on grayscale or color Doppler imaging. Doppler waveforms show normal direction of venous flow, normal respiratory plasticity and response to augmentation. Limited views of the contralateral common femoral vein are unremarkable. OTHER None. Limitations: none IMPRESSION: No evidence of DVT in the left lower extremity. Electronically Signed   By: Ileene Patrick.D.  On: 01/03/2023 16:54   DG Chest 2 View  Result Date: 01/03/2023 CLINICAL DATA:  Shortness of breath over the last 4 days, intermittent chest pain EXAM: CHEST - 2 VIEW COMPARISON:  04/09/2022 FINDINGS: The lungs appear clear. Cardiac and mediastinal contours normal. No pleural effusion identified. Moderate upper thoracic spondylosis. IMPRESSION: 1. No active cardiopulmonary disease is radiographically apparent. 2. Moderate upper thoracic spondylosis. Electronically Signed   By: Van Clines [name]   On: 01/03/2023 14:21    EKG: See HPI   ASSESSMENT AND PLAN:   Dyspnea:  normal exam, CXR, ECG and echo in May BNP normal d dimer negative Etiology unclear Labs ok including Hct Check TSH/T4 ESR Chest Pain: atypical normal ECG negative troponin Shared decision making favor cardiac CTA Can look at lung windows and proximal pulmonary arteries as well  DM:  Discussed low carb diet.  Target hemoglobin A1c is 6.5 or less.  Continue current medications. HLD:  on statin calcium scoring to be done with CTA labs with primary Depression:  on Lexapro  Weight Loss: with new GERD shoulder pains, fatigue concern for occult cancer Told her  to ask primary about doing abdominal CT scan to r/o occult caner    Cardiac CTA, lopressor 100 mg 2 hours prior  TSH/T4, ESR F/U primary for abdominal CT scan   F/U cardiology 4-6 weeks   Signed: Jenkins Rouge 01/08/2023, 12:41 PM

## 2023-01-10 ENCOUNTER — Encounter: Payer: Self-pay | Admitting: Cardiovascular Disease

## 2023-01-10 ENCOUNTER — Ambulatory Visit: Payer: Medicare Other | Attending: Cardiovascular Disease | Admitting: Cardiovascular Disease

## 2023-01-10 VITALS — BP 108/80 | HR 94 | Ht 68.0 in | Wt 156.6 lb

## 2023-01-10 DIAGNOSIS — R072 Precordial pain: Secondary | ICD-10-CM | POA: Diagnosis present

## 2023-01-10 DIAGNOSIS — R634 Abnormal weight loss: Secondary | ICD-10-CM | POA: Diagnosis present

## 2023-01-10 DIAGNOSIS — R5383 Other fatigue: Secondary | ICD-10-CM | POA: Diagnosis present

## 2023-01-10 DIAGNOSIS — R0602 Shortness of breath: Secondary | ICD-10-CM | POA: Insufficient documentation

## 2023-01-10 DIAGNOSIS — R079 Chest pain, unspecified: Secondary | ICD-10-CM | POA: Insufficient documentation

## 2023-01-10 DIAGNOSIS — E782 Mixed hyperlipidemia: Secondary | ICD-10-CM | POA: Diagnosis present

## 2023-01-10 MED ORDER — METOPROLOL TARTRATE 100 MG PO TABS: ORAL_TABLET | ORAL | 0 refills | Status: DC

## 2023-01-10 NOTE — Patient Instructions (Addendum)
Medication Instructions:  Your physician recommends that you continue on your current medications as directed. Please refer to the Current Medication list given to you today.   Labwork: TSH T4 SED Rate  Testing/Procedures: Cardiac CT  Follow-Up: Follow up with Dr. Johnsie Cancel in 4-6 weeks.   Any Other Special Instructions Will Be Listed Below (If Applicable).     If you need a refill on your cardiac medications before your next appointment, please call your pharmacy.     Your cardiac CT will be scheduled at one of the below locations:   [facility] [address] [place], [place] [address] ([phone] [place] [address] [place], [place] [address] [phone]  [facility] [place], [place] [address] [phone]  If scheduled at [facility], please arrive at the [facility] and [facility] Entrance (Entrance C2) of [facility] 30 minutes prior to test start time. You can use the FREE valet parking offered at entrance C (encouraged to control the heart rate for the test)  Proceed to the [facility] Radiology Department (first floor) to check-in and test prep.  All radiology patients and guests should use entrance C2 at [facility], accessed from [facility], even though the hospital's physical address listed is [address]    If scheduled at [facility] or [facility], please arrive 15 mins early for check-in and test prep.   Please follow these instructions carefully (unless otherwise directed):  Take Lopressor 100 mg tablet 2 hours prior to CT  On the Night Before the Test: Be sure to Drink plenty of water. Do not consume any caffeinated/decaffeinated beverages or chocolate 12 hours prior to your test. Do not take any antihistamines 12 hours prior to your  test.  On the Day of the Test: Drink plenty of water until 1 hour prior to the test. Do not eat any food 1 hour prior to test. You may take your regular medications prior to the test.  Take metoprolol (Lopressor) two hours prior to test. HOLD Furosemide/Hydrochlorothiazide morning of the test. FEMALES- please wear underwire-free bra if available, avoid dresses & tight clothing       After the Test: Drink plenty of water. After receiving IV contrast, you may experience a mild flushed feeling. This is normal. On occasion, you may experience a mild rash up to 24 hours after the test. This is not dangerous. If this occurs, you can take Benadryl 25 mg and increase your fluid intake. If you experience trouble breathing, this can be serious. If it is severe call 911 IMMEDIATELY. If it is mild, please call our office. If you take any of these medications: Glipizide/Metformin, Avandament, Glucavance, please do not take 48 hours after completing test unless otherwise instructed.  We will call to schedule your test 2-4 weeks out understanding that some insurance companies will need an authorization prior to the service being performed.   For non-scheduling related questions, please contact the cardiac imaging nurse navigator should you have any questions/concerns: Marchia Bond, Cardiac Imaging Nurse Navigator Gordy Clement, Cardiac Imaging Nurse Navigator [facility] Direct Office Dial: [phone]   For scheduling needs, including cancellations and rescheduling, please call [place], [phone].

## 2023-01-11 ENCOUNTER — Other Ambulatory Visit: Payer: Self-pay

## 2023-01-11 ENCOUNTER — Emergency Department (HOSPITAL_COMMUNITY)
Admission: EM | Admit: 2023-01-11 | Discharge: 2023-01-11 | Disposition: A | Discharge status: Home / Self Care | Payer: Medicare Other | Attending: Student | Admitting: Student

## 2023-01-11 ENCOUNTER — Encounter (HOSPITAL_COMMUNITY): Payer: Self-pay | Admitting: *Deleted

## 2023-01-11 DIAGNOSIS — Z7901 Long term (current) use of anticoagulants: Secondary | ICD-10-CM | POA: Diagnosis not present

## 2023-01-11 DIAGNOSIS — Z203 Contact with and (suspected) exposure to rabies: Secondary | ICD-10-CM | POA: Insufficient documentation

## 2023-01-11 DIAGNOSIS — Z23 Encounter for immunization: Secondary | ICD-10-CM | POA: Insufficient documentation

## 2023-01-11 DIAGNOSIS — S01511A Laceration without foreign body of lip, initial encounter: Secondary | ICD-10-CM | POA: Insufficient documentation

## 2023-01-11 DIAGNOSIS — W540XXA Bitten by dog, initial encounter: Secondary | ICD-10-CM | POA: Diagnosis not present

## 2023-01-11 DIAGNOSIS — Z2914 Encounter for prophylactic rabies immune globin: Secondary | ICD-10-CM | POA: Diagnosis not present

## 2023-01-11 DIAGNOSIS — S0993XA Unspecified injury of face, initial encounter: Secondary | ICD-10-CM | POA: Diagnosis present

## 2023-01-11 MED ORDER — RABIES VACCINE, PCEC IM SUSR
1.0000 mL | Freq: Once | INTRAMUSCULAR | Status: AC
  Administered 2023-01-11: 1 mL via INTRAMUSCULAR
  Filled 2023-01-11: qty 1

## 2023-01-11 MED ORDER — AMOXICILLIN-POT CLAVULANATE 875-125 MG PO TABS: 1.0000 | ORAL_TABLET | Freq: Two times a day (BID) | ORAL | 0 refills | Status: DC

## 2023-01-11 MED ORDER — RABIES IMMUNE GLOBULIN 150 UNIT/ML IM INJ
20.0000 [IU]/kg | INJECTION | Freq: Once | INTRAMUSCULAR | Status: AC
  Administered 2023-01-11: 1425 [IU] via INTRAMUSCULAR
  Filled 2023-01-11: qty 10

## 2023-01-11 MED ORDER — TETANUS-DIPHTH-ACELL PERTUSSIS 5-2.5-18.5 LF-MCG/0.5 IM SUSY
0.5000 mL | PREFILLED_SYRINGE | Freq: Once | INTRAMUSCULAR | Status: AC
  Administered 2023-01-11: 0.5 mL via INTRAMUSCULAR
  Filled 2023-01-11: qty 0.5

## 2023-01-11 MED ORDER — AMOXICILLIN-POT CLAVULANATE 875-125 MG PO TABS
1.0000 | ORAL_TABLET | Freq: Once | ORAL | Status: AC
  Administered 2023-01-11: 1 via ORAL
  Filled 2023-01-11: qty 1

## 2023-01-11 MED ORDER — LIDOCAINE HCL (PF) 1 % IJ SOLN
10.0000 mL | Freq: Once | INTRAMUSCULAR | Status: AC
  Administered 2023-01-11: 10 mL
  Filled 2023-01-11: qty 10

## 2023-01-11 NOTE — ED Provider Notes (Signed)
[facility] Provider Note   CSN: [id number] Arrival date & time: 01/11/23  1752     History  Chief Complaint  Patient presents with   Animal Bite     [name] is a 67 y.o. female who presents to the Emergency Department complaining of an animal bite onset PTA. Pt was holding her neighbors dog when the dog bit her on her lip.  Patient is unsure if the dog is up-to-date with the vaccine status.  She is unsure if her tetanus status.  No meds tried prior to arrival.  Denies fever.   The history is provided by the patient. No language interpreter was used.       Home Medications Prior to Admission medications   Medication Sig Start Date End Date Taking? Authorizing Provider  amoxicillin-clavulanate (AUGMENTIN) 875-125 MG tablet Take 1 tablet by mouth every 12 (twelve) hours. 01/11/23  Yes Khalilah Hoke A, PA-C  albuterol (PROVENTIL HFA;VENTOLIN HFA) 108 (90 Base) MCG/ACT inhaler Inhale 2 puffs into the lungs every 6 (six) hours as needed for wheezing or shortness of breath.    [provider]  cycloSPORINE (RESTASIS) 0.05 % ophthalmic emulsion Place 1 drop into both eyes 2 (two) times daily.    [provider]  escitalopram (LEXAPRO) 20 MG tablet Take 10 mg by mouth at bedtime.     [provider]  Eszopiclone 3 MG TABS Take 3 mg by mouth at bedtime. Take immediately before bedtime Patient not taking: Reported on 01/10/2023    [provider]  fluticasone (FLONASE) 50 MCG/ACT nasal spray Place 1 spray into both nostrils daily as needed for allergies.  05/09/15   [provider]  HYDROmorphone (DILAUDID) 2 MG tablet Take 1-2 tablets (2-4 mg total) by mouth every 4 (four) hours as needed for moderate pain or severe pain. Patient not taking: Reported on 01/10/2023 01/15/17   Dara Lords, Alexzandrew L, PA-C  hydroxychloroquine (PLAQUENIL) 200 MG tablet Take 400 mg by mouth daily.    [provider]   lovastatin (MEVACOR) 20 MG tablet Take 20 mg by mouth at bedtime. Patient not taking: Reported on 01/10/2023    [provider]  metFORMIN (GLUCOPHAGE) 500 MG tablet Take 500 mg by mouth in the morning, at noon, and at bedtime. 12/27/22   [provider]  metoprolol tartrate (LOPRESSOR) 100 MG tablet Take 1 tablet by mouth 2 hours prior to CT 01/10/23   Josue Hector, MD  montelukast (SINGULAIR) 10 MG tablet Take 10 mg by mouth at bedtime.    [provider]  pregabalin (LYRICA) 75 MG capsule Take 75 mg by mouth 3 (three) times daily.    [provider]  rivaroxaban (XARELTO) 10 MG TABS tablet Take 1 tablet (10 mg total) by mouth daily with breakfast. Take Xarelto for two and a half more weeks following discharge from the hospital, then discontinue Xarelto. Once the patient has completed the blood thinner regimen, then take a Baby 81 mg Aspirin daily for three more weeks. Patient not taking: Reported on 01/10/2023 01/15/17   Dara Lords, Alexzandrew L, PA-C      Allergies    Aspirin    Review of Systems   Review of Systems  All other systems reviewed and are negative.   Physical Exam Updated Vital Signs BP 124/78 (BP Location: Right Arm)   Pulse 97   Temp 99.3 F (37.4 C) (Temporal)   Resp 17   Ht 5\' 8"  (  1.727 m)   Wt 72.1 kg   SpO2 98%   BMI 24.18 kg/m  Physical Exam Vitals and nursing note reviewed.  Constitutional:      General: She is not in acute distress.    Appearance: Normal appearance. She is not ill-appearing.  HENT:     Head: Normocephalic and atraumatic.     Right Ear: External ear normal.     Left Ear: External ear normal.     Mouth/Throat:      Comments: 1. 2 cm laceration noted through the upper vermillion border on the right.  2. 1.5 cm laceration noted to left upper lip Eyes:     General: No scleral icterus. Cardiovascular:     Rate and Rhythm: Normal rate.  Pulmonary:     Effort: Pulmonary effort is normal.   Musculoskeletal:        General: Normal range of motion.     Cervical back: Normal range of motion and neck supple.  Skin:    General: Skin is warm and dry.     Capillary Refill: Capillary refill takes less than 2 seconds.     Findings: Laceration present.  Neurological:     Mental Status: She is alert.     ED Results / Procedures / Treatments   Labs (all labs ordered are listed, but only abnormal results are displayed) Labs Reviewed - No data to display  EKG None  Radiology No results found.  Procedures .Marland KitchenLaceration Repair  Date/Time: 01/11/2023 10:25 PM  Performed by: Steva Colder A, PA-C Authorized by: Nehemiah Settle, PA-C   Consent:    Consent obtained:  Verbal   Consent given by:  Patient   Risks discussed:  Infection, pain and need for additional repair Universal protocol:    Patient identity confirmed:  Verbally with patient and hospital-assigned identification number Anesthesia:    Anesthesia method:  Local infiltration   Local anesthetic:  Lidocaine 1% w/o epi Laceration details:    Location:  Lip   Lip location:  Upper exterior lip   Length (cm):  2.5 Pre-procedure details:    Preparation:  Patient was prepped and draped in usual sterile fashion Exploration:    Hemostasis achieved with:  Direct pressure   Imaging outcome: foreign body not noted     Wound exploration: entire depth of wound visualized   Treatment:    Area cleansed with:  Saline   Amount of cleaning:  Standard   Irrigation solution:  Sterile saline   Irrigation method:  Syringe Skin repair:    Repair method:  Sutures   Suture size:  5-0   Suture material:  Fast-absorbing gut   Suture technique:  Simple interrupted   Number of sutures:  6 Approximation:    Approximation:  Close   Vermilion border well-aligned: yes   Repair type:    Repair type:  Simple Post-procedure details:    Dressing:  Open (no dressing)   Procedure completion:  Tolerated well, no immediate  complications .Marland KitchenLaceration Repair  Date/Time: 01/11/2023 10:26 PM  Performed by: Nehemiah Settle, PA-C Authorized by: Nehemiah Settle, PA-C   Consent:    Consent obtained:  Verbal   Consent given by:  Patient   Risks discussed:  Infection, pain and need for additional repair Universal protocol:    Patient identity confirmed:  Verbally with patient and hospital-assigned identification number Anesthesia:    Anesthesia method:  Local infiltration   Local anesthetic:  Lidocaine 1% w/o epi Laceration details:  Location:  Lip   Lip location:  Upper exterior lip   Length (cm):  1.5 Exploration:    Hemostasis achieved with:  Direct pressure   Imaging outcome: foreign body not noted     Wound exploration: entire depth of wound visualized   Treatment:    Area cleansed with:  Saline   Amount of cleaning:  Standard   Irrigation solution:  Sterile saline   Irrigation method:  Syringe Skin repair:    Repair method:  Sutures   Suture size:  5-0   Suture material:  Fast-absorbing gut   Suture technique:  Simple interrupted   Number of sutures:  8 Approximation:    Approximation:  Close Repair type:    Repair type:  Simple Post-procedure details:    Dressing:  Open (no dressing)   Procedure completion:  Tolerated well, no immediate complications     Medications Ordered in ED Medications  lidocaine (PF) (XYLOCAINE) 1 % injection 10 mL (10 mLs Infiltration Given 01/11/23 1904)  Tdap (BOOSTRIX) injection 0.5 mL (0.5 mLs Intramuscular Given 01/11/23 1902)  rabies immune globulin (HYPERRAB/KEDRAB) injection 1,425 Units (1,425 Units Intramuscular Given 01/11/23 2230)  rabies vaccine (RABAVERT) injection 1 mL (1 mL Intramuscular Given 01/11/23 2229)  amoxicillin-clavulanate (AUGMENTIN) 875-125 MG per tablet 1 tablet (1 tablet Oral Given 01/11/23 2220)    ED Course/ Medical Decision Making/ A&P                             Medical Decision Making Risk Prescription drug management.   Pt  presents with animal bite to lip onset prior to arrival.  Patient is unsure of dog's rabies status.  Tetanus is not up-to-date at this time.  Vital signs patient afebrile.  On exam patient with 1. 2 cm laceration noted through the upper vermillion border on the right. 1.5 cm laceration noted to left upper lip.  Differential diagnosis includes animal bite, foreign body, fracture, dislocation, cellulitis, abscess.   Medications:  I ordered medication including rabies, tetanus vaccine for prophylaxis I have reviewed the patients home medicines and have made adjustments as needed   Disposition: Presented suspicious for animal bite.  Doubt at this time concerns for foreign body, fracture, dislocation, cellulitis, abscess.  Tetanus updated in the ED. rabies vaccine series started in the emergency department.  After consideration of the diagnostic results and the patients response to treatment, I feel that the patient would benefit from Discharge home.  We will send prescription of Augmentin.  Supportive care measures and strict return precautions discussed with patient at bedside. Pt acknowledges and verbalizes understanding. Pt appears safe for discharge. Follow up as indicated in discharge paperwork.    This chart was dictated using voice recognition software, Dragon. Despite the best efforts of this provider to proofread and correct errors, errors may still occur which can change documentation meaning.   Final Clinical Impression(s) / ED Diagnoses Final diagnoses:  Dog bite of face, initial encounter    Rx / DC Orders ED Discharge Orders          Ordered    amoxicillin-clavulanate (AUGMENTIN) 875-125 MG tablet  Every 12 hours        01/11/23 2230              Shaden Lacher A, PA-C 01/11/23 2233    Teressa Lower, MD 01/12/23 1552

## 2023-01-11 NOTE — ED Triage Notes (Signed)
Pt bit to upper lip by neighbors dog.  Pt picked up dog that bit her due to pt's dog going after the the other dog.  Lac x 2 to upper lip.  Unknown of pt's tetanus shot.  Unknown of dog's shot records. Pt has owner's address, located in [place]

## 2023-01-11 NOTE — Discharge Instructions (Signed)
It was a pleasure taking care of you today!   Your tetanus vaccine was updated in the ED. The rabies vaccination series was started in the emergency department.  Attached is a letter so you are aware of when to proceed with the next vaccine.  You will be sent an antibiotic, Augmentin, complete the entire course of the antibiotic. You may take over the counter 500 mg tylenol every 6 hours for no more than 7 days as needed for pain. You may place ice or heat to the affected area for up to 15 minutes at a time. Ensure to place a barrier between your skin and the ice/heat. You may follow up with your primary care provider as needed. Return to the Emergency Department if you are experiencing increasing/worsening pain, discharge, color change, fever, or worsening symptoms.

## 2023-01-11 NOTE — ED Notes (Signed)
Laceration to right upper lip cleanse with NS

## 2023-01-16 ENCOUNTER — Telehealth (HOSPITAL_COMMUNITY): Payer: Self-pay | Admitting: *Deleted

## 2023-01-16 NOTE — Telephone Encounter (Signed)
Reaching out to patient to offer assistance regarding upcoming cardiac imaging study; pt verbalizes understanding of appt date/time, parking situation and where to check in, pre-test NPO status and medications ordered, and verified current allergies; name and call back number provided for further questions should they arise  Gordy Clement RN Navigator Cardiac Imaging [facility] [phone] office [phone] cell  Patient to take 100mg  metoprolol tartrate two hours prior to her cardiac CT scan. She is aware to arrive at 12:30 PM.

## 2023-01-17 ENCOUNTER — Ambulatory Visit (HOSPITAL_COMMUNITY)
Admission: RE | Admit: 2023-01-17 | Discharge: 2023-01-17 | Disposition: A | Discharge status: Home / Self Care | Payer: Medicare Other | Source: Ambulatory Visit | Attending: Cardiovascular Disease | Admitting: Cardiovascular Disease

## 2023-01-17 DIAGNOSIS — R0602 Shortness of breath: Secondary | ICD-10-CM | POA: Diagnosis not present

## 2023-01-17 DIAGNOSIS — R072 Precordial pain: Secondary | ICD-10-CM

## 2023-01-17 MED ORDER — IOHEXOL 350 MG/ML SOLN
100.0000 mL | Freq: Once | INTRAVENOUS | Status: AC | PRN
  Administered 2023-01-17: 100 mL via INTRAVENOUS

## 2023-01-17 MED ORDER — NITROGLYCERIN 0.4 MG SL SUBL
SUBLINGUAL_TABLET | SUBLINGUAL | Status: AC
  Administered 2023-01-17: 0.8 mg via SUBLINGUAL
  Filled 2023-01-17: qty 2

## 2023-01-17 MED ORDER — NITROGLYCERIN 0.4 MG SL SUBL: 0.8000 mg | SUBLINGUAL_TABLET | Freq: Once | SUBLINGUAL | Status: AC

## 2023-02-11 ENCOUNTER — Ambulatory Visit: Payer: Medicare Other | Admitting: Cardiology

## 2023-02-18 LAB — LIPID PANEL
LDL Cholesterol: 104
Triglycerides: 104 (ref 40–160)

## 2023-02-18 LAB — BASIC METABOLIC PANEL
BUN: 16 (ref 4–21)
Creatinine: 0.7 (ref 0.5–1.1)
Glucose: 165

## 2023-02-18 LAB — TSH: TSH: 0.26 — AB (ref 0.41–5.90)

## 2023-02-18 LAB — COMPREHENSIVE METABOLIC PANEL: eGFR: 96

## 2023-02-18 LAB — HEMOGLOBIN A1C: Hemoglobin A1C: 9

## 2023-02-18 LAB — VITAMIN D 25 HYDROXY (VIT D DEFICIENCY, FRACTURES): Vit D, 25-Hydroxy: 30

## 2023-04-16 ENCOUNTER — Other Ambulatory Visit (HOSPITAL_COMMUNITY): Payer: Self-pay | Admitting: Family Medicine

## 2023-04-16 ENCOUNTER — Ambulatory Visit (HOSPITAL_COMMUNITY)
Admission: RE | Admit: 2023-04-16 | Discharge: 2023-04-16 | Disposition: A | Discharge status: Home / Self Care | Payer: Medicare Other | Source: Ambulatory Visit | Attending: Family Medicine | Admitting: Family Medicine

## 2023-04-16 DIAGNOSIS — M542 Cervicalgia: Secondary | ICD-10-CM | POA: Diagnosis not present

## 2023-04-29 ENCOUNTER — Encounter: Payer: Self-pay | Admitting: Nurse Practitioner

## 2023-05-27 ENCOUNTER — Ambulatory Visit (HOSPITAL_COMMUNITY)
Admission: RE | Admit: 2023-05-27 | Discharge: 2023-05-27 | Disposition: A | Discharge status: Home / Self Care | Payer: Medicare Other | Source: Ambulatory Visit | Attending: Family Medicine | Admitting: Family Medicine

## 2023-05-27 DIAGNOSIS — M542 Cervicalgia: Secondary | ICD-10-CM | POA: Diagnosis present

## 2023-06-25 NOTE — Patient Instructions (Signed)

## 2023-06-26 ENCOUNTER — Encounter: Payer: Self-pay | Admitting: Nurse Practitioner

## 2023-06-26 ENCOUNTER — Ambulatory Visit (INDEPENDENT_AMBULATORY_CARE_PROVIDER_SITE_OTHER): Payer: Medicare Other | Admitting: Nurse Practitioner

## 2023-06-26 VITALS — BP 109/69 | HR 69 | Ht 68.0 in | Wt 151.2 lb

## 2023-06-26 DIAGNOSIS — E785 Hyperlipidemia, unspecified: Secondary | ICD-10-CM

## 2023-06-26 DIAGNOSIS — Z7984 Long term (current) use of oral hypoglycemic drugs: Secondary | ICD-10-CM | POA: Diagnosis not present

## 2023-06-26 DIAGNOSIS — E1165 Type 2 diabetes mellitus with hyperglycemia: Secondary | ICD-10-CM | POA: Diagnosis not present

## 2023-06-26 LAB — POCT GLYCOSYLATED HEMOGLOBIN (HGB A1C): Hemoglobin A1C: 6.7 % — AB (ref 4.0–5.6)

## 2023-06-26 NOTE — Progress Notes (Signed)
Endocrinology Consult Note       06/26/2023, 4:37 PM   Subjective:    Patient ID: [name], female    DOB: 05/09/1956.  [name] is being seen in consultation for management of currently uncontrolled symptomatic diabetes requested by  Billie Lade, MD.   Past Medical History:  Diagnosis Date   Arthritis    Diabetes mellitus without complication (HCC)    PONV (postoperative nausea and vomiting)    Pulmonary embolism (HCC) 2005    Past Surgical History:  Procedure Laterality Date   ABDOMINAL HYSTERECTOMY     CARPAL TUNNEL RELEASE Left 2000   CARPAL TUNNEL RELEASE Right 2000   CATARACT EXTRACTION W/PHACO Right 11/21/2014   Procedure: CATARACT EXTRACTION PHACO AND INTRAOCULAR LENS PLACEMENT (IOC);  Surgeon: Gemma Payor, MD;  Location: [facility];  Service: Ophthalmology;  Laterality: Right;  CDE:5.78   TOTAL KNEE ARTHROPLASTY Right 01/13/2017   Procedure: RIGHT TOTAL KNEE ARTHROPLASTY;  Surgeon: Ollen Gross, MD;  Location: [facility];  Service: Orthopedics;  Laterality: Right;  Adductor Block    Social History   Socioeconomic History   Marital status: Married    Spouse name: Not on file   Number of children: Not on file   Years of education: Not on file   Highest education level: Not on file  Occupational History   Not on file  Tobacco Use   Smoking status: Never   Smokeless tobacco: Never  Vaping Use   Vaping status: Never Used  Substance and Sexual Activity   Alcohol use: No   Drug use: No   Sexual activity: Not on file  Other Topics Concern   Not on file  Social History Narrative   Not on file   Social Determinants of Health   Financial Resource Strain: Not on file  Food Insecurity: Not on file  Transportation Needs: Not on file  Physical Activity: Not on file  Stress: Not on file  Social Connections: Not on file    History reviewed. No pertinent family history.  Outpatient  Encounter Medications as of 06/26/2023  Medication Sig   albuterol (PROVENTIL HFA;VENTOLIN HFA) 108 (90 Base) MCG/ACT inhaler Inhale 2 puffs into the lungs every 6 (six) hours as needed for wheezing or shortness of breath.   diclofenac (VOLTAREN) 75 MG EC tablet Take 75 mg by mouth 2 (two) times daily.   dicyclomine (BENTYL) 10 MG capsule Take 10-20 mg by mouth 3 (three) times daily as needed.   escitalopram (LEXAPRO) 20 MG tablet Take 10 mg by mouth at bedtime.    fluticasone (FLONASE) 50 MCG/ACT nasal spray Place 1 spray into both nostrils daily as needed for allergies.    HYDROmorphone (DILAUDID) 2 MG tablet Take 1-2 tablets (2-4 mg total) by mouth every 4 (four) hours as needed for moderate pain or severe pain.   hydroxychloroquine (PLAQUENIL) 200 MG tablet Take 400 mg by mouth daily.   metFORMIN (GLUCOPHAGE) 500 MG tablet Take 500 mg by mouth 2 (two) times daily with a meal.   montelukast (SINGULAIR) 10 MG tablet Take 10 mg by mouth at bedtime.   omeprazole (PRILOSEC) 40 MG capsule Take 40 mg by mouth every  morning.   ondansetron (ZOFRAN) 4 MG tablet Take 4 mg by mouth every 8 (eight) hours as needed.   pregabalin (LYRICA) 75 MG capsule Take 75 mg by mouth 3 (three) times daily.   [DISCONTINUED] OZEMPIC, 0.25 OR 0.5 MG/DOSE, 2 MG/3ML SOPN Inject 2.5 mg into the skin once a week.   [DISCONTINUED] amoxicillin-clavulanate (AUGMENTIN) 875-125 MG tablet Take 1 tablet by mouth every 12 (twelve) hours. (Patient not taking: Reported on 06/26/2023)   [DISCONTINUED] cycloSPORINE (RESTASIS) 0.05 % ophthalmic emulsion Place 1 drop into both eyes 2 (two) times daily. (Patient not taking: Reported on 06/26/2023)   [DISCONTINUED] Eszopiclone 3 MG TABS Take 3 mg by mouth at bedtime. Take immediately before bedtime (Patient not taking: Reported on 01/10/2023)   [DISCONTINUED] lovastatin (MEVACOR) 20 MG tablet Take 20 mg by mouth at bedtime. (Patient not taking: Reported on 01/10/2023)   [DISCONTINUED] metoprolol  tartrate (LOPRESSOR) 100 MG tablet Take 1 tablet by mouth 2 hours prior to CT (Patient not taking: Reported on 06/26/2023)   [DISCONTINUED] rivaroxaban (XARELTO) 10 MG TABS tablet Take 1 tablet (10 mg total) by mouth daily with breakfast. Take Xarelto for two and a half more weeks following discharge from the hospital, then discontinue Xarelto. Once the patient has completed the blood thinner regimen, then take a Baby 81 mg Aspirin daily for three more weeks. (Patient not taking: Reported on 01/10/2023)   No facility-administered encounter medications on file as of 06/26/2023.    ALLERGIES: Allergies  Allergen Reactions   Aspirin Other (See Comments)    "burns my stomach"    VACCINATION STATUS: Immunization History  Administered Date(s) Administered   Rabies, IM 01/11/2023   Tdap 06/28/2015, 01/11/2023    Diabetes She presents for her initial diabetic visit. She has type 2 diabetes mellitus. Onset time: diagnosed at approx age of 28. Her disease course has been improving. There are no hypoglycemic associated symptoms. Associated symptoms include polydipsia and weight loss. There are no hypoglycemic complications. Symptoms are stable. Diabetic complications include heart disease. Risk factors for coronary artery disease include diabetes mellitus, dyslipidemia and family history. Current diabetic treatment includes oral agent (monotherapy) (and Ozempic). She is compliant with treatment most of the time. Her weight is decreasing steadily. She is following a generally unhealthy diet. When asked about meal planning, she reported none. She has not had a previous visit with a dietitian. She participates in exercise intermittently. (She presents today for her consultation with her meter showing fasting values ranging from 120-202.  Her POCT A1c today is 6.7%, improving from last A1c of 9%.  She has been on Actos and Farxiga in the past and did not tolerate either one.  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  She eats 2-3 meals per day, skipping on occasion, and occasionally snacks.  She stays active in her occupation.  She is UTD on eye exam, has never seen podiatry in the past.  ) She does not see a podiatrist.Eye exam is current.     Review of systems  Constitutional: + decreasing body weight, current Body mass index is 22.99 kg/m., no fatigue, no subjective hyperthermia, no subjective hypothermia Eyes: no blurry vision, no xerophthalmia ENT: no sore throat, no nodules palpated in throat, no dysphagia/odynophagia, no hoarseness Cardiovascular: no chest pain, no shortness of breath, no palpitations, no leg swelling Respiratory: no cough, no shortness of breath Gastrointestinal: no nausea/vomiting/diarrhea, + intermittent constipation Musculoskeletal: + diffuse muscle/joint aches (hx RA) Skin:  no rashes, no hyperemia Neurological: no tremors, no numbness, no tingling, no dizziness Psychiatric: no depression, no anxiety  Objective:     BP 109/69 (BP Location: Left Arm, Patient Position: Sitting, Cuff Size: Large)   Pulse 69   Ht 5\' 8"  (1.727 m)   Wt 151 lb 3.2 oz (68.6 kg)   BMI 22.99 kg/m   Wt Readings from Last 3 Encounters:  06/26/23 151 lb 3.2 oz (68.6 kg)  01/11/23 159 lb (72.1 kg)  01/10/23 156 lb 9.6 oz (71 kg)     BP Readings from Last 3 Encounters:  06/26/23 109/69  01/17/23 109/68  01/11/23 122/81     Physical Exam- Limited  Constitutional:  Body mass index is 22.99 kg/m. , not in acute distress, normal state of mind Eyes:  EOMI, no exophthalmos Neck: Supple Cardiovascular: RRR, no murmurs, rubs, or gallops, no edema Respiratory: Adequate breathing efforts, no crackles, rales, rhonchi, or wheezing Musculoskeletal: no gross deformities, strength intact in all four extremities, no gross restriction of joint movements Skin:  no rashes, no hyperemia Neurological: no tremor with outstretched hands   Diabetic  Foot Exam - Simple   No data filed     CMP ( most recent) CMP     Component Value Date/Time   NA 139 01/03/2023 1405   K 3.8 01/03/2023 1405   CL 105 01/03/2023 1405   CO2 25 01/03/2023 1405   GLUCOSE 147 (H) 01/03/2023 1405   BUN 16 02/18/2023 0000   CREATININE 0.7 02/18/2023 0000   CREATININE 0.66 01/03/2023 1405   CALCIUM 9.0 01/03/2023 1405   PROT 7.2 01/08/2017 1153   ALBUMIN 4.6 01/08/2017 1153   AST 20 01/08/2017 1153   ALT 18 01/08/2017 1153   ALKPHOS 86 01/08/2017 1153   BILITOT 0.6 01/08/2017 1153   EGFR 96 02/18/2023 0000   GFRNONAA >60 01/03/2023 1405     Diabetic Labs (most recent): Lab Results  Component Value Date   HGBA1C 6.7 (A) 06/26/2023   HGBA1C 9 02/18/2023   HGBA1C 7.0 (H) 01/08/2017     Lipid Panel ( most recent) Lipid Panel     Component Value Date/Time   TRIG 104 02/18/2023 0000   LDLCALC 104 02/18/2023 0000      Lab Results  Component Value Date   TSH 0.26 (A) 02/18/2023           Assessment & Plan:   1) Type 2 diabetes mellitus with hyperglycemia, without long-term current use of insulin (HCC)  She presents today for her consultation with her meter showing fasting values ranging from 120-202.  Her POCT A1c today is 6.7%, improving from last A1c of 9%.  She has been on Actos and Farxiga in the past and did not tolerate either one.  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  She eats 2-3 meals per day, skipping on occasion, and occasionally snacks.  She stays active in her occupation.  She is UTD on eye exam, has never seen podiatry in the past.    - [name] has currently uncontrolled symptomatic type 2 DM since 67 years of age, with most recent A1c of 6.7 %.   -Recent labs reviewed.  - I had a long discussion with her about the progressive nature of diabetes and the pathology behind its complications. -her diabetes is complicated by CAD and chronic intermittent use of  steroids and she remains at a high risk for more acute and chronic  complications which include CAD, CVA, CKD, retinopathy, and neuropathy. These are all discussed in detail with her.  The following Lifestyle Medicine recommendations according to American College of Lifestyle Medicine [facility]) were discussed and offered to patient and she agrees to start the journey:  A. Whole Foods, Plant-based plate comprising of fruits and vegetables, plant-based proteins, whole-grain carbohydrates was discussed in detail with the patient.   A list for source of those nutrients were also provided to the patient.  Patient will use only water or unsweetened tea for hydration. B.  The need to stay away from risky substances including alcohol, smoking; obtaining 7 to 9 hours of restorative sleep, at least 150 minutes of moderate intensity exercise weekly, the importance of healthy social connections,  and stress reduction techniques were discussed. C.  A full color page of  Calorie density of various food groups per pound showing examples of each food groups was provided to the patient.  - I have counseled her on diet and weight management by adopting a carbohydrate restricted/protein rich diet. Patient is encouraged to switch to unprocessed or minimally processed complex starch and increased protein intake (animal or plant source), fruits, and vegetables. -  she is advised to stick to a routine mealtimes to eat 3 meals a day and avoid unnecessary snacks (to snack only to correct hypoglycemia).   - she acknowledges that there is a room for improvement in her food and drink choices. - Suggestion is made for her to avoid simple carbohydrates from her diet including Cakes, Sweet Desserts, Ice Cream, Soda (diet and regular), Sweet Tea, Candies, Chips, Cookies, Store Bought Juices, Alcohol in Excess of 1-2 drinks a day, Artificial Sweeteners, Coffee Creamer, and "Sugar-free" Products. This will help patient to have more stable  blood glucose profile and potentially avoid unintended weight gain.  - I have approached her with the following individualized plan to manage her diabetes and patient agrees:    -she is encouraged to start monitoring glucose once daily, before breakfast, to log their readings on the clinic sheets provided, and bring them to review at follow up appointment in 3 months.  - Adjustment parameters are given to her for hypo and hyperglycemia in writing. - she is encouraged to call clinic for blood glucose levels less than 70 or above 300 mg /dl. - she is advised to continue Metformin 500 mg po twice daily after meals (cannot tolerate higher doses due to GI SE), therapeutically suitable for patient .  - her Ozempic will be discontinued, risk outweighs benefit for this patient.  she is not an ideal candidate for incretin therapy given body habitus and BMI <25.   - Specific targets for  A1c; LDL, HDL, and Triglycerides were discussed with the patient.  2) Blood Pressure /Hypertension:  her blood pressure is controlled to target without the use of antihypertensive medications.    3) Lipids/Hyperlipidemia:    Review of her recent lipid panel from 02/18/23 showed uncontrolled LDL at 104 .  She is not currently on any lipid lowering medications at this time.   4)  Weight/Diet:  her Body mass index is 22.99 kg/m.  -  she is NOT a candidate for weight loss. I discussed with her the fact that loss of 5 - 10% of her  current body weight will have the most impact on her diabetes management.  Exercise, and detailed carbohydrates information provided  -  detailed on discharge instructions.  5) Chronic Care/Health Maintenance: -she is not on ACEI/ARB  or Statin medications and is encouraged to initiate and continue to follow up with Ophthalmology, Dentist, Podiatrist at least yearly or according to recommendations, and advised to stay away from smoking. I have recommended yearly flu vaccine and pneumonia vaccine  at least every 5 years; moderate intensity exercise for up to 150 minutes weekly; and sleep for at least 7 hours a day.  - she is advised to maintain close follow up with Durwin Nora Lucina Mellow, MD for primary care needs, as well as her other providers for optimal and coordinated care.   - Time spent in this patient care: 60 min, of which > 50% was spent in counseling her about her diabetes and the rest reviewing her blood glucose logs, discussing her hypoglycemia and hyperglycemia episodes, reviewing her current and previous labs/studies (including abstraction from other facilities) and medications doses and developing a long term treatment plan based on the latest standards of care/guidelines; and documenting her care.    Please refer to Patient Instructions for Blood Glucose Monitoring and Insulin/Medications Dosing Guide" in media tab for additional information. Please also refer to "Patient Self Inventory" in the Media tab for reviewed elements of pertinent patient history.  Westly Pam participated in the discussions, expressed understanding, and voiced agreement with the above plans.  All questions were answered to her satisfaction. she is encouraged to contact clinic should she have any questions or concerns prior to her return visit.     Follow up plan: - Return in about 3 months (around 09/26/2023) for Diabetes F/U with A1c in office, No previsit labs.    Ronny Bacon, [facility] [address] [place], [place] [address] Phone: [phone] Fax: [phone]  06/26/2023, 4:37 PM

## 2023-08-05 ENCOUNTER — Ambulatory Visit (INDEPENDENT_AMBULATORY_CARE_PROVIDER_SITE_OTHER): Payer: Medicare Other | Admitting: Internal Medicine

## 2023-08-05 ENCOUNTER — Encounter: Payer: Self-pay | Admitting: Internal Medicine

## 2023-08-05 ENCOUNTER — Ambulatory Visit: Payer: Medicare Other | Admitting: Internal Medicine

## 2023-08-05 VITALS — BP 110/66 | HR 72 | Ht 68.0 in | Wt 155.4 lb

## 2023-08-05 DIAGNOSIS — M1380 Other specified arthritis, unspecified site: Secondary | ICD-10-CM

## 2023-08-05 DIAGNOSIS — Z5320 Procedure and treatment not carried out because of patient's decision for unspecified reasons: Secondary | ICD-10-CM

## 2023-08-05 DIAGNOSIS — Z79899 Other long term (current) drug therapy: Secondary | ICD-10-CM | POA: Diagnosis not present

## 2023-08-05 DIAGNOSIS — Z1231 Encounter for screening mammogram for malignant neoplasm of breast: Secondary | ICD-10-CM

## 2023-08-05 DIAGNOSIS — J3089 Other allergic rhinitis: Secondary | ICD-10-CM | POA: Diagnosis not present

## 2023-08-05 DIAGNOSIS — E1165 Type 2 diabetes mellitus with hyperglycemia: Secondary | ICD-10-CM

## 2023-08-05 DIAGNOSIS — F3342 Major depressive disorder, recurrent, in full remission: Secondary | ICD-10-CM

## 2023-08-05 DIAGNOSIS — Z7984 Long term (current) use of oral hypoglycemic drugs: Secondary | ICD-10-CM

## 2023-08-05 DIAGNOSIS — M0600 Rheumatoid arthritis without rheumatoid factor, unspecified site: Secondary | ICD-10-CM | POA: Insufficient documentation

## 2023-08-05 DIAGNOSIS — E1169 Type 2 diabetes mellitus with other specified complication: Secondary | ICD-10-CM | POA: Insufficient documentation

## 2023-08-05 DIAGNOSIS — J309 Allergic rhinitis, unspecified: Secondary | ICD-10-CM | POA: Insufficient documentation

## 2023-08-05 DIAGNOSIS — F329 Major depressive disorder, single episode, unspecified: Secondary | ICD-10-CM | POA: Insufficient documentation

## 2023-08-05 DIAGNOSIS — E785 Hyperlipidemia, unspecified: Secondary | ICD-10-CM

## 2023-08-05 DIAGNOSIS — G894 Chronic pain syndrome: Secondary | ICD-10-CM

## 2023-08-05 MED ORDER — HYDROCODONE-ACETAMINOPHEN 10-325 MG PO TABS: 1.0000 | ORAL_TABLET | Freq: Three times a day (TID) | ORAL | Status: DC | PRN

## 2023-08-05 NOTE — Progress Notes (Signed)
New Patient Office Visit  Subjective    Patient ID: [name], female    DOB: 02/22/1956  Age: 67 y.o. MRN: [id number]  CC:  Chief Complaint  Patient presents with   Establish Care   HPI [name] presents to establish care.  She is a 67 year old woman who endorses a past medical history significant for T2DM, seronegative RA, diffuse osteoarthritis, seasonal allergies, MDD, HLD, and chronic pain syndrome.  Previously followed by Dr. Sudie Bailey.  [name] reports feeling fairly well today.  Her acute concern is wanting to get her diabetes under better control.  She is currently followed by endocrinology.  She is otherwise asymptomatic and does not have any acute concerns to discuss.  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  She denies tobacco, alcohol, and illicit drug use.  Chronic medical conditions and outstanding preventative care items discussed today are individually addressed A/P below.  Outpatient Encounter Medications as of 08/05/2023  Medication Sig   albuterol (PROVENTIL HFA;VENTOLIN HFA) 108 (90 Base) MCG/ACT inhaler Inhale 2 puffs into the lungs every 6 (six) hours as needed for wheezing or shortness of breath.   cetirizine (ZYRTEC) 10 MG tablet Take 10 mg by mouth daily.   diclofenac (VOLTAREN) 75 MG EC tablet Take 75 mg by mouth 2 (two) times daily.   escitalopram (LEXAPRO) 20 MG tablet Take 10 mg by mouth at bedtime.    fluticasone (FLONASE) 50 MCG/ACT nasal spray Place 1 spray into both nostrils daily as needed for allergies.    HYDROcodone-acetaminophen (NORCO) 10-325 MG tablet Take 1 tablet by mouth 4 (four) times daily as needed.   hydroxychloroquine (PLAQUENIL) 200 MG tablet Take 400 mg by mouth daily.   metFORMIN (GLUCOPHAGE) 500 MG tablet Take 500 mg by mouth 2 (two) times daily with a meal.   montelukast (SINGULAIR) 10 MG tablet Take 10 mg by mouth at bedtime.   omeprazole (PRILOSEC) 40 MG capsule Take 40 mg by mouth every morning.   ondansetron (ZOFRAN) 4 MG  tablet Take 4 mg by mouth every 8 (eight) hours as needed.   pregabalin (LYRICA) 75 MG capsule Take 75 mg by mouth 3 (three) times daily.   [DISCONTINUED] dicyclomine (BENTYL) 10 MG capsule Take 10-20 mg by mouth 3 (three) times daily as needed.   [DISCONTINUED] HYDROmorphone (DILAUDID) 2 MG tablet Take 1-2 tablets (2-4 mg total) by mouth every 4 (four) hours as needed for moderate pain or severe pain.   No facility-administered encounter medications on file as of 08/05/2023.    Past Medical History:  Diagnosis Date   Arthritis    Diabetes mellitus without complication (HCC)    PONV (postoperative nausea and vomiting)    Pulmonary embolism (HCC) 2005    Past Surgical History:  Procedure Laterality Date   ABDOMINAL HYSTERECTOMY     CARPAL TUNNEL RELEASE Left 2000   CARPAL TUNNEL RELEASE Right 2000   CATARACT EXTRACTION W/PHACO Right 11/21/2014   Procedure: CATARACT EXTRACTION PHACO AND INTRAOCULAR LENS PLACEMENT (IOC);  Surgeon: Gemma Payor, MD;  Location: [facility];  Service: Ophthalmology;  Laterality: Right;  CDE:5.78   TOTAL KNEE ARTHROPLASTY Right 01/13/2017   Procedure: RIGHT TOTAL KNEE ARTHROPLASTY;  Surgeon: Ollen Gross, MD;  Location: [facility];  Service: Orthopedics;  Laterality: Right;  Adductor Block    History reviewed. No pertinent family history.  Social History   Socioeconomic History   Marital status: Married    Spouse name: Not on file   Number of children: Not on  file   Years of education: Not on file   Highest education level: Not on file  Occupational History   Not on file  Tobacco Use   Smoking status: Never   Smokeless tobacco: Never  Vaping Use   Vaping status: Never Used  Substance and Sexual Activity   Alcohol use: No   Drug use: No   Sexual activity: Not on file  Other Topics Concern   Not on file  Social History Narrative   Not on file   Social Determinants of Health   Financial Resource Strain: Not on file  Food Insecurity: Not on file   Transportation Needs: Not on file  Physical Activity: Not on file  Stress: Not on file  Social Connections: Not on file  Intimate Partner Violence: Not on file   Review of Systems  Constitutional:  Negative for chills and fever.  HENT:  Negative for sore throat.   Respiratory:  Negative for cough and shortness of breath.   Cardiovascular:  Negative for chest pain, palpitations and leg swelling.  Gastrointestinal:  Negative for abdominal pain, blood in stool, constipation, diarrhea, nausea and vomiting.  Genitourinary:  Negative for dysuria and hematuria.  Musculoskeletal:  Positive for joint pain (Diffuse, chronic joint pain). Negative for myalgias.  Skin:  Negative for itching and rash.  Neurological:  Negative for dizziness and headaches.  Psychiatric/Behavioral:  Negative for depression and suicidal ideas.    Objective    BP 110/66   Pulse 72   Ht 5\' 8"  (1.727 m)   Wt 155 lb 6.4 oz (70.5 kg)   SpO2 95%   BMI 23.63 kg/m   Physical Exam Vitals reviewed.  Constitutional:      General: She is not in acute distress.    Appearance: Normal appearance. She is not toxic-appearing.  HENT:     Head: Normocephalic and atraumatic.     Right Ear: External ear normal.     Left Ear: External ear normal.     Nose: Nose normal. No congestion or rhinorrhea.     Mouth/Throat:     Mouth: Mucous membranes are moist.     Pharynx: Oropharynx is clear. No oropharyngeal exudate or posterior oropharyngeal erythema.  Eyes:     General: No scleral icterus.    Extraocular Movements: Extraocular movements intact.     Conjunctiva/sclera: Conjunctivae normal.     Pupils: Pupils are equal, round, and reactive to light.  Cardiovascular:     Rate and Rhythm: Normal rate and regular rhythm.     Pulses: Normal pulses.     Heart sounds: Normal heart sounds. No murmur heard.    No friction rub. No gallop.  Pulmonary:     Effort: Pulmonary effort is normal.     Breath sounds: Normal breath sounds.  No wheezing, rhonchi or rales.  Abdominal:     General: Abdomen is flat. Bowel sounds are normal. There is no distension.     Palpations: Abdomen is soft.     Tenderness: There is no abdominal tenderness.  Musculoskeletal:        General: No swelling. Normal range of motion.     Cervical back: Normal range of motion.     Right lower leg: No edema.     Left lower leg: No edema.  Lymphadenopathy:     Cervical: No cervical adenopathy.  Skin:    General: Skin is warm and dry.     Capillary Refill: Capillary refill takes less than 2 seconds.  Coloration: Skin is not jaundiced.  Neurological:     General: No focal deficit present.     Mental Status: She is alert and oriented to person, place, and time.  Psychiatric:        Mood and Affect: Mood normal.        Behavior: Behavior normal.    Assessment & Plan:   Problem List Items Addressed This Visit       Allergic rhinitis    Symptoms are adequately controlled with routine use of Zyrtec, fluticasone, and montelukast.  She also has an albuterol inhaler for rescue use.      Type 2 diabetes mellitus with hyperglycemia (HCC)    A1c 6.7 on labs from July.  She is followed by endocrinology.  Currently prescribed metformin 500 mg twice daily. -No medication changes are indicated today.  Endocrinology follow-up is scheduled for October. -Due for diabetic eye exam.  I have recommended that she follow-up with her ophthalmologist in [place].      Hyperlipidemia associated with type 2 diabetes mellitus (HCC)    LDL 104 on labs from March.  Not currently on statin therapy.  Plan to further address at follow-up in 3 months.      Seronegative arthritis - Primary    Previously documented history of seronegative RA.  She is currently prescribed hydroxychloroquine.  This has been managed by her PCP.  She describes experiencing flares when she has trialed off of hydroxychloroquine. -Rheumatology referral placed today at patient's request -I  have recommended routine eye exam given chronic hydroxychloroquine use      MDD (major depressive disorder)    Mood stable with Lexapro 20 mg daily.  No medication changes are indicated today.      Chronic pain syndrome    Secondary to seronegative RA and diffuse osteoarthritis.  Pain is currently managed with hydrocodone-acetaminophen 10-325 mg 3 times daily as needed as well as Lyrica 75 mg 3 times daily.  This was managed by her PCP previously.  PDMP reviewed and is appropriate. -Controlled substance agreement signed today -UDS pending      Breast cancer screening by mammogram    Screening bilateral mammogram ordered today.  She has a family medical history significant for breast cancer in her mother.      Return in about 3 months (around 11/05/2023).   Billie Lade, MD

## 2023-08-05 NOTE — Assessment & Plan Note (Signed)
Mood stable with Lexapro 20 mg daily.  No medication changes are indicated today.

## 2023-08-05 NOTE — Patient Instructions (Addendum)
It was a pleasure to see you today.  Thank you for giving [place] the opportunity to be involved in your care.  Below is a brief recap of your visit and next steps.  We will plan to see you again in 3 months.  Summary You have established care today Rheumatology referral placed Mammogram ordered I strongly recommend following up with ophthalmology. Please let me know if a referral is needed Follow up in 3 months  Schedule your Medicare Annual Wellness Visit at checkout.

## 2023-08-05 NOTE — Assessment & Plan Note (Signed)
Screening bilateral mammogram ordered today.  She has a family medical history significant for breast cancer in her mother.

## 2023-08-05 NOTE — Assessment & Plan Note (Signed)
Previously documented history of seronegative RA.  She is currently prescribed hydroxychloroquine.  This has been managed by her PCP.  She describes experiencing flares when she has trialed off of hydroxychloroquine. -Rheumatology referral placed today at patient's request -I have recommended routine eye exam given chronic hydroxychloroquine use

## 2023-08-05 NOTE — Assessment & Plan Note (Signed)
LDL 104 on labs from March.  Not currently on statin therapy.  Plan to further address at follow-up in 3 months.

## 2023-08-05 NOTE — Assessment & Plan Note (Signed)
Symptoms are adequately controlled with routine use of Zyrtec, fluticasone, and montelukast.  She also has an albuterol inhaler for rescue use.

## 2023-08-05 NOTE — Assessment & Plan Note (Signed)
A1c 6.7 on labs from July.  She is followed by endocrinology.  Currently prescribed metformin 500 mg twice daily. -No medication changes are indicated today.  Endocrinology follow-up is scheduled for October. -Due for diabetic eye exam.  I have recommended that she follow-up with her ophthalmologist in [place].

## 2023-08-05 NOTE — Assessment & Plan Note (Signed)
Secondary to seronegative RA and diffuse osteoarthritis.  Pain is currently managed with hydrocodone-acetaminophen 10-325 mg 3 times daily as needed as well as Lyrica 75 mg 3 times daily.  This was managed by her PCP previously.  PDMP reviewed and is appropriate. -Controlled substance agreement signed today -UDS pending

## 2023-08-13 LAB — TOXASSURE SELECT 13 (MW), URINE

## 2023-08-18 ENCOUNTER — Ambulatory Visit (HOSPITAL_COMMUNITY)
Admission: RE | Admit: 2023-08-18 | Discharge: 2023-08-18 | Disposition: A | Discharge status: Home / Self Care | Payer: Medicare Other | Source: Ambulatory Visit | Attending: Internal Medicine | Admitting: Internal Medicine

## 2023-08-18 ENCOUNTER — Encounter (HOSPITAL_COMMUNITY): Payer: Self-pay

## 2023-08-18 DIAGNOSIS — Z1231 Encounter for screening mammogram for malignant neoplasm of breast: Secondary | ICD-10-CM | POA: Insufficient documentation

## 2023-09-01 ENCOUNTER — Other Ambulatory Visit: Payer: Self-pay | Admitting: Internal Medicine

## 2023-09-01 ENCOUNTER — Ambulatory Visit
Admission: EM | Admit: 2023-09-01 | Discharge: 2023-09-01 | Disposition: A | Discharge status: Home / Self Care | Payer: Medicare Other | Attending: Nurse Practitioner | Admitting: Nurse Practitioner

## 2023-09-01 DIAGNOSIS — G894 Chronic pain syndrome: Secondary | ICD-10-CM

## 2023-09-01 DIAGNOSIS — J0110 Acute frontal sinusitis, unspecified: Secondary | ICD-10-CM | POA: Diagnosis not present

## 2023-09-01 MED ORDER — AMOXICILLIN-POT CLAVULANATE 875-125 MG PO TABS: 1.0000 | ORAL_TABLET | Freq: Two times a day (BID) | ORAL | 0 refills | Status: DC

## 2023-09-01 MED ORDER — PSEUDOEPH-BROMPHEN-DM 30-2-10 MG/5ML PO SYRP: 5.0000 mL | ORAL_SOLUTION | Freq: Four times a day (QID) | ORAL | 0 refills | Status: DC | PRN

## 2023-09-01 MED ORDER — HYDROCODONE-ACETAMINOPHEN 10-325 MG PO TABS: 1.0000 | ORAL_TABLET | Freq: Three times a day (TID) | ORAL | 0 refills | Status: DC | PRN

## 2023-09-01 NOTE — ED Triage Notes (Signed)
Pt presents with headache, ear pain, eye pain, cough  x2 weeks. Taking OTC sinus medication, and starting taking amoxicillin x 2 days that she had at home.

## 2023-09-01 NOTE — Discharge Instructions (Addendum)
Take medication as directed. Continue your current allergy medication regimen. Increase fluids and get plenty of rest. May take over-the-counter Tylenol as needed for pain, fever, or general discomfort. Recommend normal saline nasal spray to help with nasal congestion throughout the day. For your cough, it may be helpful to use a humidifier at bedtime during sleep and to sleep elevated while cough symptoms persist.. If symptoms do not improve after completing the treatment provided today, please follow-up with your primary care physician for further evaluation. Follow-up as needed.

## 2023-09-01 NOTE — ED Provider Notes (Signed)
[facility]    CSN: [id number] Arrival date & time: 09/01/23  0943      History   Chief Complaint Chief Complaint  Patient presents with   Headache    HPI [name] is a 67 y.o. female.   The history is provided by the patient.   Patient presents with a 2-week history of headache, bilateral eye pressure, ear pain, nasal congestion, runny nose, and cough.  Patient reports last evening, she had a fever around 101.5.  She denies ear drainage, wheezing, difficulty breathing, chest pain, vomiting, or diarrhea.  She reports the postnasal drainage is causing her to be nauseated.  She reports she has been taking over-the-counter sinus medication along with Sudafed for her symptoms.  She reports that she started taking amoxicillin 2 to 3 days ago with no relief of her symptoms.  She denies any obvious known sick contacts.  Past Medical History:  Diagnosis Date   Arthritis    Diabetes mellitus without complication (HCC)    PONV (postoperative nausea and vomiting)    Pulmonary embolism (HCC) 2005    Patient Active Problem List   Diagnosis Date Noted   Type 2 diabetes mellitus with hyperglycemia (HCC) 08/05/2023   Seronegative arthritis 08/05/2023   Allergic rhinitis 08/05/2023   MDD (major depressive disorder) 08/05/2023   Chronic pain syndrome 08/05/2023   Hyperlipidemia associated with type 2 diabetes mellitus (HCC) 08/05/2023   Breast cancer screening by mammogram 08/05/2023   OA (osteoarthritis) of knee 01/13/2017    Past Surgical History:  Procedure Laterality Date   ABDOMINAL HYSTERECTOMY     CARPAL TUNNEL RELEASE Left 2000   CARPAL TUNNEL RELEASE Right 2000   CATARACT EXTRACTION W/PHACO Right 11/21/2014   Procedure: CATARACT EXTRACTION PHACO AND INTRAOCULAR LENS PLACEMENT (IOC);  Surgeon: Gemma Payor, MD;  Location: [facility];  Service: Ophthalmology;  Laterality: Right;  CDE:5.78   TOTAL KNEE ARTHROPLASTY Right 01/13/2017   Procedure: RIGHT TOTAL KNEE  ARTHROPLASTY;  Surgeon: Ollen Gross, MD;  Location: [facility];  Service: Orthopedics;  Laterality: Right;  Adductor Block    OB History   No obstetric history on file.      Home Medications    Prior to Admission medications   Medication Sig Start Date End Date Taking? Authorizing Provider  amoxicillin-clavulanate (AUGMENTIN) 875-125 MG tablet Take 1 tablet by mouth every 12 (twelve) hours. 09/01/23  Yes Yania Bogie-Warren, Sadie Haber, NP  brompheniramine-pseudoephedrine-DM 30-2-10 MG/5ML syrup Take 5 mLs by mouth 4 (four) times daily as needed. 09/01/23  Yes Adyline Huberty-Warren, Sadie Haber, NP  cetirizine (ZYRTEC) 10 MG tablet Take 10 mg by mouth daily. 06/17/23  Yes [provider]  diclofenac (VOLTAREN) 75 MG EC tablet Take 75 mg by mouth 2 (two) times daily. 06/17/23  Yes [provider]  escitalopram (LEXAPRO) 20 MG tablet Take 10 mg by mouth at bedtime.    Yes [provider]  fluticasone (FLONASE) 50 MCG/ACT nasal spray Place 1 spray into both nostrils daily as needed for allergies.  05/09/15  Yes [provider]  HYDROcodone-acetaminophen (NORCO) 10-325 MG tablet Take 1 tablet by mouth 3 (three) times daily as needed for severe pain. 08/05/23  Yes Billie Lade, MD  hydroxychloroquine (PLAQUENIL) 200 MG tablet Take 400 mg by mouth daily.   Yes [provider]  metFORMIN (GLUCOPHAGE) 500 MG tablet Take 500 mg by mouth 2 (two) times daily with a meal. 12/27/22  Yes [provider]  montelukast (SINGULAIR) 10 MG tablet Take  10 mg by mouth at bedtime.   Yes [provider]  omeprazole (PRILOSEC) 40 MG capsule Take 40 mg by mouth every morning. 05/13/23  Yes [provider]  ondansetron (ZOFRAN) 4 MG tablet Take 4 mg by mouth every 8 (eight) hours as needed. 05/13/23  Yes [provider]  pregabalin (LYRICA) 75 MG capsule Take 75 mg by mouth 3 (three) times daily.   Yes [provider]  albuterol (PROVENTIL HFA;VENTOLIN  HFA) 108 (90 Base) MCG/ACT inhaler Inhale 2 puffs into the lungs every 6 (six) hours as needed for wheezing or shortness of breath.    [provider]    Family History History reviewed. No pertinent family history.  Social History Social History   Tobacco Use   Smoking status: Never   Smokeless tobacco: Never  Vaping Use   Vaping status: Never Used  Substance Use Topics   Alcohol use: No   Drug use: No     Allergies   Aspirin   Review of Systems Review of Systems Per HPI  Physical Exam Triage Vital Signs ED Triage Vitals [09/01/23 1037]  Encounter Vitals Group     BP 136/69     Systolic BP Percentile      Diastolic BP Percentile      Pulse Rate 79     Resp 18     Temp 97.7 F (36.5 C)     Temp Source Oral     SpO2 97 %     Weight      Height      Head Circumference      Peak Flow      Pain Score 8     Pain Loc      Pain Education      Exclude from Growth Chart    No data found.  Updated Vital Signs BP 136/69 (BP Location: Right Arm)   Pulse 79   Temp 97.7 F (36.5 C) (Oral)   Resp 18   SpO2 97%   Visual Acuity Right Eye Distance:   Left Eye Distance:   Bilateral Distance:    Right Eye Near:   Left Eye Near:    Bilateral Near:     Physical Exam Vitals and nursing note reviewed.  Constitutional:      General: She is not in acute distress.    Appearance: She is well-developed.  HENT:     Head: Normocephalic.     Right Ear: Tympanic membrane, ear canal and external ear normal.     Left Ear: Tympanic membrane, ear canal and external ear normal.     Nose: Congestion present.     Right Turbinates: Enlarged and swollen.     Left Turbinates: Enlarged and swollen.     Mouth/Throat:     Lips: Pink.     Mouth: Mucous membranes are moist.     Pharynx: Posterior oropharyngeal erythema and postnasal drip present.     Comments: Cobblestoning present to posterior oropharynx Eyes:     Extraocular Movements: Extraocular movements intact.      Conjunctiva/sclera: Conjunctivae normal.     Pupils: Pupils are equal, round, and reactive to light.  Cardiovascular:     Rate and Rhythm: Normal rate and regular rhythm.     Pulses: Normal pulses.     Heart sounds: Normal heart sounds.  Pulmonary:     Effort: Pulmonary effort is normal. No respiratory distress.     Breath sounds: Normal breath sounds. No stridor. No wheezing,  rhonchi or rales.  Abdominal:     General: Bowel sounds are normal.     Palpations: Abdomen is soft.     Tenderness: There is no abdominal tenderness.  Musculoskeletal:     Cervical back: Normal range of motion.  Lymphadenopathy:     Cervical: No cervical adenopathy.  Skin:    General: Skin is warm and dry.  Neurological:     General: No focal deficit present.     Mental Status: She is alert and oriented to person, place, and time.  Psychiatric:        Mood and Affect: Mood normal.        Behavior: Behavior normal.      UC Treatments / Results  Labs (all labs ordered are listed, but only abnormal results are displayed) Labs Reviewed - No data to display  EKG   Radiology No results found.  Procedures Procedures (including critical care time)  Medications Ordered in UC Medications - No data to display  Initial Impression / Assessment and Plan / UC Course  I have reviewed the triage vital signs and the nursing notes.  Pertinent labs & imaging results that were available during my care of the patient were reviewed by me and considered in my medical decision making (see chart for details).  The patient is well-appearing, she is in no acute distress, vital signs are stable.  Suspect acute frontal sinusitis.  Viral testing is not indicated given the duration of the patient's symptoms.  Patient does have frontal sinus tenderness, recent fever of 101.5, and continual nasal congestion and runny nose.  Will treat empirically with Augmentin 875/125 mg tablets for the next 7 days.  For her cough,  Bromfed-DM was prescribed.  Patient advised to continue her current allergy medication and Flonase daily for nasal congestion and runny nose.  Supportive care recommendations were provided and discussed with the patient to include over-the-counter analgesics, normal saline nasal spray, and use of a humidifier in the bedroom at nighttime during sleep.  Patient advised to follow-up with her PCP if symptoms fail to improve with treatment.  Patient is in agreement with this plan of care and verbalizes understanding.  All questions were answered.  Patient stable for discharge.  Final Clinical Impressions(s) / UC Diagnoses   Final diagnoses:  Acute frontal sinusitis, recurrence not specified     Discharge Instructions      Take medication as directed. Continue your current allergy medication regimen. Increase fluids and get plenty of rest. May take over-the-counter Tylenol as needed for pain, fever, or general discomfort. Recommend normal saline nasal spray to help with nasal congestion throughout the day. For your cough, it may be helpful to use a humidifier at bedtime during sleep and to sleep elevated while cough symptoms persist.. If symptoms do not improve after completing the treatment provided today, please follow-up with your primary care physician for further evaluation. Follow-up as needed.      ED Prescriptions     Medication Sig Dispense Auth. Provider   amoxicillin-clavulanate (AUGMENTIN) 875-125 MG tablet Take 1 tablet by mouth every 12 (twelve) hours. 14 tablet Yazid Pop-Warren, Sadie Haber, NP   brompheniramine-pseudoephedrine-DM 30-2-10 MG/5ML syrup Take 5 mLs by mouth 4 (four) times daily as needed. 140 mL Kobe Jansma-Warren, Sadie Haber, NP      PDMP not reviewed this encounter.   Abran Cantor, NP 09/01/23 [phone]

## 2023-09-12 ENCOUNTER — Other Ambulatory Visit: Payer: Self-pay | Admitting: Internal Medicine

## 2023-09-12 DIAGNOSIS — Z1212 Encounter for screening for malignant neoplasm of rectum: Secondary | ICD-10-CM

## 2023-09-12 DIAGNOSIS — Z1211 Encounter for screening for malignant neoplasm of colon: Secondary | ICD-10-CM

## 2023-09-29 ENCOUNTER — Ambulatory Visit (INDEPENDENT_AMBULATORY_CARE_PROVIDER_SITE_OTHER): Payer: Medicare Other | Admitting: Nurse Practitioner

## 2023-09-29 ENCOUNTER — Encounter: Payer: Self-pay | Admitting: Nurse Practitioner

## 2023-09-29 VITALS — BP 109/66 | HR 71 | Ht 68.0 in | Wt 153.6 lb

## 2023-09-29 DIAGNOSIS — E559 Vitamin D deficiency, unspecified: Secondary | ICD-10-CM

## 2023-09-29 DIAGNOSIS — E1165 Type 2 diabetes mellitus with hyperglycemia: Secondary | ICD-10-CM | POA: Diagnosis not present

## 2023-09-29 DIAGNOSIS — Z7984 Long term (current) use of oral hypoglycemic drugs: Secondary | ICD-10-CM

## 2023-09-29 LAB — POCT UA - MICROALBUMIN
Albumin/Creatinine Ratio, Urine, POC: <30
Creatinine, POC: 200 mg/dL
Microalbumin Ur, POC: 30 mg/L

## 2023-09-29 LAB — POCT GLYCOSYLATED HEMOGLOBIN (HGB A1C): Hemoglobin A1C: 7.9 % — AB (ref 4.0–5.6)

## 2023-09-29 MED ORDER — METFORMIN HCL 500 MG PO TABS: 500.0000 mg | ORAL_TABLET | Freq: Two times a day (BID) | ORAL | 3 refills | Status: DC

## 2023-09-29 MED ORDER — SITAGLIPTIN PHOSPHATE 50 MG PO TABS: 50.0000 mg | ORAL_TABLET | Freq: Every day | ORAL | 1 refills | Status: DC

## 2023-09-29 MED ORDER — CONTOUR NEXT TEST VI STRP: ORAL_STRIP | 12 refills | Status: DC

## 2023-09-29 NOTE — Progress Notes (Signed)
Endocrinology Follow Up Note       09/29/2023, 3:31 PM   Subjective:    Patient ID: [name], female    DOB: September 25, 1956.  [name] is being seen in follow up after being seen in consultation for management of currently uncontrolled symptomatic diabetes requested by  Billie Lade, MD.   Past Medical History:  Diagnosis Date   Arthritis    Diabetes mellitus without complication (HCC)    PONV (postoperative nausea and vomiting)    Pulmonary embolism (HCC) 2005    Past Surgical History:  Procedure Laterality Date   ABDOMINAL HYSTERECTOMY     CARPAL TUNNEL RELEASE Left 2000   CARPAL TUNNEL RELEASE Right 2000   CATARACT EXTRACTION W/PHACO Right 11/21/2014   Procedure: CATARACT EXTRACTION PHACO AND INTRAOCULAR LENS PLACEMENT (IOC);  Surgeon: Gemma Payor, MD;  Location: [facility];  Service: Ophthalmology;  Laterality: Right;  CDE:5.78   TOTAL KNEE ARTHROPLASTY Right 01/13/2017   Procedure: RIGHT TOTAL KNEE ARTHROPLASTY;  Surgeon: Ollen Gross, MD;  Location: [facility];  Service: Orthopedics;  Laterality: Right;  Adductor Block    Social History   Socioeconomic History   Marital status: Married    Spouse name: Not on file   Number of children: Not on file   Years of education: Not on file   Highest education level: Not on file  Occupational History   Not on file  Tobacco Use   Smoking status: Never   Smokeless tobacco: Never  Vaping Use   Vaping status: Never Used  Substance and Sexual Activity   Alcohol use: No   Drug use: No   Sexual activity: Not on file  Other Topics Concern   Not on file  Social History Narrative   Not on file   Social Determinants of Health   Financial Resource Strain: Not on file  Food Insecurity: Not on file  Transportation Needs: Not on file  Physical Activity: Not on file  Stress: Not on file  Social Connections: Not on file    History reviewed. No  pertinent family history.  Outpatient Encounter Medications as of 09/29/2023  Medication Sig   albuterol (PROVENTIL HFA;VENTOLIN HFA) 108 (90 Base) MCG/ACT inhaler Inhale 2 puffs into the lungs every 6 (six) hours as needed for wheezing or shortness of breath.   cetirizine (ZYRTEC) 10 MG tablet Take 10 mg by mouth daily.   diclofenac (VOLTAREN) 75 MG EC tablet Take 75 mg by mouth 2 (two) times daily.   escitalopram (LEXAPRO) 20 MG tablet Take 10 mg by mouth at bedtime.    fluticasone (FLONASE) 50 MCG/ACT nasal spray Place 1 spray into both nostrils daily as needed for allergies.    glucose blood (CONTOUR NEXT TEST) test strip Use as instructed to monitor glucose once daily   HYDROcodone-acetaminophen (NORCO) 10-325 MG tablet Take 1 tablet by mouth 3 (three) times daily as needed for severe pain.   hydroxychloroquine (PLAQUENIL) 200 MG tablet Take 400 mg by mouth daily.   montelukast (SINGULAIR) 10 MG tablet Take 10 mg by mouth at bedtime.   omeprazole (PRILOSEC) 40 MG capsule Take 40 mg by mouth every morning.   ondansetron (ZOFRAN) 4  MG tablet Take 4 mg by mouth every 8 (eight) hours as needed.   pregabalin (LYRICA) 75 MG capsule Take 75 mg by mouth 3 (three) times daily.   sitaGLIPtin (JANUVIA) 50 MG tablet Take 1 tablet (50 mg total) by mouth daily.   [DISCONTINUED] metFORMIN (GLUCOPHAGE) 500 MG tablet Take 500 mg by mouth 2 (two) times daily with a meal.   amoxicillin-clavulanate (AUGMENTIN) 875-125 MG tablet Take 1 tablet by mouth every 12 (twelve) hours. (Patient not taking: Reported on 09/29/2023)   brompheniramine-pseudoephedrine-DM 30-2-10 MG/5ML syrup Take 5 mLs by mouth 4 (four) times daily as needed. (Patient not taking: Reported on 09/29/2023)   metFORMIN (GLUCOPHAGE) 500 MG tablet Take 1 tablet (500 mg total) by mouth 2 (two) times daily with a meal.   No facility-administered encounter medications on file as of 09/29/2023.    ALLERGIES: Allergies  Allergen Reactions    Aspirin Other (See Comments)    "burns my stomach"    VACCINATION STATUS: Immunization History  Administered Date(s) Administered   Rabies, IM 01/11/2023   Tdap 06/28/2015, 01/11/2023    Diabetes She presents for her follow-up diabetic visit. She has type 2 diabetes mellitus. Onset time: diagnosed at approx age of 59. Her disease course has been worsening. There are no hypoglycemic associated symptoms. Associated symptoms include fatigue. Pertinent negatives for diabetes include no polydipsia and no weight loss. There are no hypoglycemic complications. Symptoms are stable. Diabetic complications include heart disease. Risk factors for coronary artery disease include diabetes mellitus, dyslipidemia and family history. Current diabetic treatment includes oral agent (monotherapy). She is compliant with treatment most of the time. Her weight is fluctuating minimally. She is following a generally healthy diet. When asked about meal planning, she reported none. She has not had a previous visit with a dietitian. She participates in exercise intermittently. Her home blood glucose trend is fluctuating minimally. Her overall blood glucose range is 140-180 mg/dl. (She presents today with her meter, no logs, showing slightly above target glycemic profile.  Her POCT A1c today is 7.9%, increasing from last visit of 6.7%.  She has readings ranging from 109-215 in her meter.  She notes she has no energy, is so fatigued she is finding it hard to get through her day.  ) She does not see a podiatrist.Eye exam is current.     Review of systems  Constitutional: + stable body weight, current Body mass index is 23.35 kg/m., no fatigue, no subjective hyperthermia, no subjective hypothermia Eyes: no blurry vision, no xerophthalmia ENT: no sore throat, no nodules palpated in throat, no dysphagia/odynophagia, no hoarseness Cardiovascular: no chest pain, no shortness of breath, no palpitations, no leg  swelling Respiratory: no cough, no shortness of breath Gastrointestinal: no nausea/vomiting/diarrhea, + intermittent constipation-improved since discontinuation of Ozempic Musculoskeletal: + diffuse muscle/joint aches (hx RA) Skin: no rashes, no hyperemia Neurological: no tremors, no numbness, no tingling, no dizziness Psychiatric: no depression, no anxiety  Objective:     BP 109/66 (BP Location: Right Arm, Patient Position: Sitting, Cuff Size: Large)   Pulse 71   Ht 5\' 8"  (1.727 m)   Wt 153 lb 9.6 oz (69.7 kg)   BMI 23.35 kg/m   Wt Readings from Last 3 Encounters:  09/29/23 153 lb 9.6 oz (69.7 kg)  08/05/23 155 lb 6.4 oz (70.5 kg)  06/26/23 151 lb 3.2 oz (68.6 kg)     BP Readings from Last 3 Encounters:  09/29/23 109/66  09/01/23 136/69  08/05/23 110/66  Physical Exam- Limited  Constitutional:  Body mass index is 23.35 kg/m. , not in acute distress, normal state of mind Eyes:  EOMI, no exophthalmos Musculoskeletal: no gross deformities, strength intact in all four extremities, no gross restriction of joint movements Skin:  no rashes, no hyperemia Neurological: no tremor with outstretched hands   Diabetic Foot Exam - Simple   Simple Foot Form Diabetic Foot exam was performed with the following findings: Yes 09/29/2023  2:07 PM  Visual Inspection No deformities, no ulcerations, no other skin breakdown bilaterally: Yes Sensation Testing Intact to touch and monofilament testing bilaterally: Yes Pulse Check Posterior Tibialis and Dorsalis pulse intact bilaterally: Yes Comments     CMP ( most recent) CMP     Component Value Date/Time   NA 139 01/03/2023 1405   K 3.8 01/03/2023 1405   CL 105 01/03/2023 1405   CO2 25 01/03/2023 1405   GLUCOSE 147 (H) 01/03/2023 1405   BUN 16 02/18/2023 0000   CREATININE 0.7 02/18/2023 0000   CREATININE 0.66 01/03/2023 1405   CALCIUM 9.0 01/03/2023 1405   PROT 7.2 01/08/2017 1153   ALBUMIN 4.6 01/08/2017 1153   AST 20  01/08/2017 1153   ALT 18 01/08/2017 1153   ALKPHOS 86 01/08/2017 1153   BILITOT 0.6 01/08/2017 1153   EGFR 96 02/18/2023 0000   GFRNONAA >60 01/03/2023 1405     Diabetic Labs (most recent): Lab Results  Component Value Date   HGBA1C 7.9 (A) 09/29/2023   HGBA1C 6.7 (A) 06/26/2023   HGBA1C 9 02/18/2023   MICROALBUR 30 mg/L 09/29/2023     Lipid Panel ( most recent) Lipid Panel     Component Value Date/Time   TRIG 104 02/18/2023 0000   LDLCALC 104 02/18/2023 0000      Lab Results  Component Value Date   TSH 0.26 (A) 02/18/2023           Assessment & Plan:   1) Type 2 diabetes mellitus with hyperglycemia, without long-term current use of insulin (HCC)  She presents today with her meter, no logs, showing slightly above target glycemic profile.  Her POCT A1c today is 7.9%, increasing from last visit of 6.7%.  She has readings ranging from 109-215 in her meter.  She notes she has no energy, is so fatigued she is finding it hard to get through her day.      - SHAUNTAVIA ECKLAND has currently uncontrolled symptomatic type 2 DM since 67 years of age.   -Recent labs reviewed.  - I had a long discussion with her about the progressive nature of diabetes and the pathology behind its complications. -her diabetes is complicated by CAD and chronic intermittent use of steroids and she remains at a high risk for more acute and chronic complications which include CAD, CVA, CKD, retinopathy, and neuropathy. These are all discussed in detail with her.  The following Lifestyle Medicine recommendations according to American College of Lifestyle Medicine [facility]) were discussed and offered to patient and she agrees to start the journey:  A. Whole Foods, Plant-based plate comprising of fruits and vegetables, plant-based proteins, whole-grain carbohydrates was discussed in detail with the patient.   A list for source of those nutrients were also provided to the patient.  Patient will use only water  or unsweetened tea for hydration. B.  The need to stay away from risky substances including alcohol, smoking; obtaining 7 to 9 hours of restorative sleep, at least 150 minutes of moderate intensity exercise weekly, the  importance of healthy social connections,  and stress reduction techniques were discussed. C.  A full color page of  Calorie density of various food groups per pound showing examples of each food groups was provided to the patient.  - Nutritional counseling repeated at each appointment due to patients tendency to fall back in to old habits.  - The patient admits there is a room for improvement in their diet and drink choices. -  Suggestion is made for the patient to avoid simple carbohydrates from their diet including Cakes, Sweet Desserts / Pastries, Ice Cream, Soda (diet and regular), Sweet Tea, Candies, Chips, Cookies, Sweet Pastries, Store Bought Juices, Alcohol in Excess of 1-2 drinks a day, Artificial Sweeteners, Coffee Creamer, and "Sugar-free" Products. This will help patient to have stable blood glucose profile and potentially avoid unintended weight gain.   - I encouraged the patient to switch to unprocessed or minimally processed complex starch and increased protein intake (animal or plant source), fruits, and vegetables.   - Patient is advised to stick to a routine mealtimes to eat 3 meals a day and avoid unnecessary snacks (to snack only to correct hypoglycemia).  - I have approached her with the following individualized plan to manage her diabetes and patient agrees:   -She is advised to continue Metformin 500 mg po twice daily after meals (cannot tolerate higher doses due to GI SE), therapeutically suitable for patient.  I discussed and initiated Januvia 50 mg po daily to help with prandial spikes.  -she is encouraged to continue monitoring glucose once daily, before breakfast, and to call the clinic if she has readings less than 70 or above 300 for 3 tests in a  row.  - Adjustment parameters are given to her for hypo and hyperglycemia in writing.  - her Ozempic was previously discontinued, risk outweighs benefit for this patient.  she is not an ideal candidate for incretin therapy given body habitus and BMI <25.   - Specific targets for  A1c; LDL, HDL, and Triglycerides were discussed with the patient.  2) Blood Pressure /Hypertension:  her blood pressure is controlled to target without the use of antihypertensive medications.    3) Lipids/Hyperlipidemia:    Review of her recent lipid panel from 02/18/23 showed uncontrolled LDL at 104 .  She is not currently on any lipid lowering medications at this time.  Will recheck lipid panel prior to next visit.  4)  Weight/Diet:  her Body mass index is 23.35 kg/m.  -  she is NOT a candidate for weight loss. I discussed with her the fact that loss of 5 - 10% of her  current body weight will have the most impact on her diabetes management.  Exercise, and detailed carbohydrates information provided  -  detailed on discharge instructions.  5) Chronic Care/Health Maintenance: -she is not on ACEI/ARB or Statin medications and is encouraged to initiate and continue to follow up with Ophthalmology, Dentist, Podiatrist at least yearly or according to recommendations, and advised to stay away from smoking. I have recommended yearly flu vaccine and pneumonia vaccine at least every 5 years; moderate intensity exercise for up to 150 minutes weekly; and sleep for at least 7 hours a day.  6) Abnormal TSH Her last TSH level was low in March 2024, no one mentioned it was abnormal.  She reports she did have an aunt that had hypothyroidism.  She denies any radiation exposure, never had any imaging of her thyroid in the past.  Will  recheck more comprehensive thyroid panel and reach out with the results and plan moving forward.  7)Fatigue I am also checking Vitamin D to help assess for etiology for her profound fatigue.  She notes  she is scheduled to see rheumatology in the new year.  - she is advised to maintain close follow up with Durwin Nora Lucina Mellow, MD for primary care needs, as well as her other providers for optimal and coordinated care.     I spent  45  minutes in the care of the patient today including review of labs from CMP, Lipids, Thyroid Function, Hematology (current and previous including abstractions from other facilities); face-to-face time discussing  her blood glucose readings/logs, discussing hypoglycemia and hyperglycemia episodes and symptoms, medications doses, her options of short and long term treatment based on the latest standards of care / guidelines;  discussion about incorporating lifestyle medicine;  and documenting the encounter. Risk reduction counseling performed per USPSTF guidelines to reduce obesity and cardiovascular risk factors.     Please refer to Patient Instructions for Blood Glucose Monitoring and Insulin/Medications Dosing Guide"  in media tab for additional information. Please  also refer to " Patient Self Inventory" in the Media  tab for reviewed elements of pertinent patient history.  Westly Pam participated in the discussions, expressed understanding, and voiced agreement with the above plans.  All questions were answered to her satisfaction. she is encouraged to contact clinic should she have any questions or concerns prior to her return visit.     Follow up plan: - Return in about 3 months (around 12/30/2023) for Diabetes F/U with A1c in office, Previsit labs, Bring meter and logs.    Ronny Bacon, [facility] [address] [place], [place] [address] Phone: [phone] Fax: [phone]  09/29/2023, 3:31 PM

## 2023-10-04 LAB — COMPREHENSIVE METABOLIC PANEL
ALT: 20 [IU]/L (ref 0–32)
AST: 21 [IU]/L (ref 0–40)
Albumin: 4.3 g/dL (ref 3.9–4.9)
Alkaline Phosphatase: 105 [IU]/L (ref 44–121)
BUN/Creatinine Ratio: 19 (ref 12–28)
BUN: 12 mg/dL (ref 8–27)
Bilirubin Total: 0.3 mg/dL (ref 0.0–1.2)
CO2: 23 mmol/L (ref 20–29)
Calcium: 9 mg/dL (ref 8.7–10.3)
Chloride: 104 mmol/L (ref 96–106)
Creatinine, Ser: 0.64 mg/dL (ref 0.57–1.00)
Globulin, Total: 1.7 g/dL (ref 1.5–4.5)
Glucose: 138 mg/dL — ABNORMAL HIGH (ref 70–99)
Potassium: 4.1 mmol/L (ref 3.5–5.2)
Sodium: 141 mmol/L (ref 134–144)
Total Protein: 6 g/dL (ref 6.0–8.5)
eGFR: 97 mL/min/{1.73_m2} (ref >59)

## 2023-10-04 LAB — LIPID PANEL
Chol/HDL Ratio: 3.1 ratio (ref 0.0–4.4)
Cholesterol, Total: 156 mg/dL (ref 100–199)
HDL: 51 mg/dL (ref >39)
LDL Chol Calc (NIH): 90 mg/dL (ref 0–99)
Triglycerides: 76 mg/dL (ref 0–149)
VLDL Cholesterol Cal: 15 mg/dL (ref 5–40)

## 2023-10-04 LAB — T4, FREE: Free T4: 1.32 ng/dL (ref 0.82–1.77)

## 2023-10-04 LAB — THYROID PEROXIDASE ANTIBODY: Thyroperoxidase Ab SerPl-aCnc: 15 [IU]/mL (ref 0–34)

## 2023-10-04 LAB — THYROGLOBULIN ANTIBODY: Thyroglobulin Antibody: <1 [IU]/mL (ref 0.0–0.9)

## 2023-10-04 LAB — VITAMIN D 25 HYDROXY (VIT D DEFICIENCY, FRACTURES): Vit D, 25-Hydroxy: 25.7 ng/mL — ABNORMAL LOW (ref 30.0–100.0)

## 2023-10-04 LAB — TSH: TSH: 0.461 u[IU]/mL (ref 0.450–4.500)

## 2023-10-04 LAB — T3, FREE: T3, Free: 2.7 pg/mL (ref 2.0–4.4)

## 2023-10-06 ENCOUNTER — Telehealth: Payer: Self-pay | Admitting: *Deleted

## 2023-10-06 ENCOUNTER — Encounter: Payer: Self-pay | Admitting: Nurse Practitioner

## 2023-10-06 ENCOUNTER — Other Ambulatory Visit: Payer: Self-pay | Admitting: Nurse Practitioner

## 2023-10-06 MED ORDER — VITAMIN D (ERGOCALCIFEROL) 1.25 MG (50000 UNIT) PO CAPS: 50000.0000 [IU] | ORAL_CAPSULE | ORAL | 0 refills | Status: DC

## 2023-10-06 NOTE — Telephone Encounter (Signed)
Noted that Alphonzo Lemmings has sent in for the Vitamin D, 50,000 units weekly for the patient and also sent to the patient the results of her labs.

## 2023-10-06 NOTE — Telephone Encounter (Signed)
-----   Message from Dani Gobble sent at 10/06/2023  7:04 AM EDT ----- Lorain Childes I sent patient message going over recent labs.  I also sent in Ergocalciferol 50000 units weekly x 24 weeks to [facility] for her low vitamin D.

## 2023-10-06 NOTE — Progress Notes (Signed)
FYI I sent patient message going over recent labs.  I also sent in Ergocalciferol 50000 units weekly x 24 weeks to [facility] for her low vitamin D.

## 2023-10-20 ENCOUNTER — Telehealth: Payer: Self-pay | Admitting: *Deleted

## 2023-10-20 NOTE — Telephone Encounter (Signed)
Patient was called and a message was left with [name]'s results of her lab work and that she had a prescription on file for vitamin D at her pharmacy to start. If patient has any questions to please call our office.

## 2023-10-20 NOTE — Telephone Encounter (Signed)
Patient was called and given results of lab work and she was also made aware that she had a prescription on file for Vitamin D. If she has any additional questions or concerns to call our office back.

## 2023-11-10 ENCOUNTER — Encounter: Payer: Self-pay | Admitting: Internal Medicine

## 2023-11-10 ENCOUNTER — Ambulatory Visit (INDEPENDENT_AMBULATORY_CARE_PROVIDER_SITE_OTHER): Payer: Medicare Other | Admitting: Internal Medicine

## 2023-11-10 VITALS — BP 118/74 | HR 86 | Ht 68.0 in | Wt 157.8 lb

## 2023-11-10 DIAGNOSIS — E785 Hyperlipidemia, unspecified: Secondary | ICD-10-CM

## 2023-11-10 DIAGNOSIS — M1380 Other specified arthritis, unspecified site: Secondary | ICD-10-CM

## 2023-11-10 DIAGNOSIS — G894 Chronic pain syndrome: Secondary | ICD-10-CM

## 2023-11-10 DIAGNOSIS — F5104 Psychophysiologic insomnia: Secondary | ICD-10-CM

## 2023-11-10 DIAGNOSIS — E1169 Type 2 diabetes mellitus with other specified complication: Secondary | ICD-10-CM

## 2023-11-10 DIAGNOSIS — F3342 Major depressive disorder, recurrent, in full remission: Secondary | ICD-10-CM

## 2023-11-10 DIAGNOSIS — Z23 Encounter for immunization: Secondary | ICD-10-CM | POA: Insufficient documentation

## 2023-11-10 DIAGNOSIS — G4700 Insomnia, unspecified: Secondary | ICD-10-CM | POA: Insufficient documentation

## 2023-11-10 DIAGNOSIS — E559 Vitamin D deficiency, unspecified: Secondary | ICD-10-CM | POA: Insufficient documentation

## 2023-11-10 MED ORDER — HYDROXYZINE PAMOATE 25 MG PO CAPS: 25.0000 mg | ORAL_CAPSULE | Freq: Three times a day (TID) | ORAL | 0 refills | Status: DC | PRN

## 2023-11-10 MED ORDER — ATORVASTATIN CALCIUM 10 MG PO TABS: 10.0000 mg | ORAL_TABLET | Freq: Every day | ORAL | 3 refills | Status: DC

## 2023-11-10 MED ORDER — DULOXETINE HCL 30 MG PO CPEP: 30.0000 mg | ORAL_CAPSULE | Freq: Every day | ORAL | 3 refills | Status: DC

## 2023-11-10 MED ORDER — DICLOFENAC SODIUM 1 % EX GEL: 2.0000 g | Freq: Four times a day (QID) | CUTANEOUS | 2 refills | Status: AC

## 2023-11-10 NOTE — Progress Notes (Signed)
Established Patient Office Visit  Subjective   Patient ID: [name], female    DOB: [birth year]  Age: 67 y.o. MRN: [id number]  Chief Complaint  Patient presents with   Arthritis    Follow up    [name] returns to care today for routine follow-up.  She was last evaluated by me on 8/27 as a new patient presenting to establish care.  No medication changes were made and she was referred to rheumatology in the setting of seronegative arthritis.  In the interim, she has been evaluated by endocrinology for follow-up.  There have otherwise been no acute interval events. [name] reports feeling poorly today.  She endorses insomnia and chronic musculoskeletal pain.  Overall her energy level has somewhat improved since recently starting high-dose, weekly vitamin D supplementation.  Past Medical History:  Diagnosis Date   Arthritis    Diabetes mellitus without complication (HCC)    PONV (postoperative nausea and vomiting)    Pulmonary embolism (HCC) 2005   Past Surgical History:  Procedure Laterality Date   ABDOMINAL HYSTERECTOMY     CARPAL TUNNEL RELEASE Left 2000   CARPAL TUNNEL RELEASE Right 2000   CATARACT EXTRACTION W/PHACO Right 11/21/2014   Procedure: CATARACT EXTRACTION PHACO AND INTRAOCULAR LENS PLACEMENT (IOC);  Surgeon: Gemma Payor, MD;  Location: [facility];  Service: Ophthalmology;  Laterality: Right;  CDE:5.78   TOTAL KNEE ARTHROPLASTY Right 01/13/2017   Procedure: RIGHT TOTAL KNEE ARTHROPLASTY;  Surgeon: Ollen Gross, MD;  Location: [facility];  Service: Orthopedics;  Laterality: Right;  Adductor Block   Social History   Tobacco Use   Smoking status: Never   Smokeless tobacco: Never  Vaping Use   Vaping status: Never Used  Substance Use Topics   Alcohol use: No   Drug use: No   History reviewed. No pertinent family history. Allergies  Allergen Reactions   Aspirin Other (See Comments)    "burns my stomach"   Review of Systems  Musculoskeletal:  Positive for back pain  and joint pain (Diffuse, chronic joint pain).  Psychiatric/Behavioral:  The patient has insomnia.   All other systems reviewed and are negative.    Objective:     BP 118/74 (BP Location: Right Arm, Patient Position: Sitting, Cuff Size: Normal)   Pulse 86   Ht 5\' 8"  (1.727 m)   Wt 157 lb 12.8 oz (71.6 kg)   SpO2 97%   BMI 23.99 kg/m  BP Readings from Last 3 Encounters:  11/10/23 118/74  09/29/23 109/66  09/01/23 136/69   Physical Exam Vitals reviewed.  Constitutional:      General: She is not in acute distress.    Appearance: Normal appearance. She is not toxic-appearing.  HENT:     Head: Normocephalic and atraumatic.     Right Ear: External ear normal.     Left Ear: External ear normal.     Nose: Nose normal. No congestion or rhinorrhea.     Mouth/Throat:     Mouth: Mucous membranes are moist.     Pharynx: Oropharynx is clear. No oropharyngeal exudate or posterior oropharyngeal erythema.  Eyes:     General: No scleral icterus.    Extraocular Movements: Extraocular movements intact.     Conjunctiva/sclera: Conjunctivae normal.     Pupils: Pupils are equal, round, and reactive to light.  Cardiovascular:     Rate and Rhythm: Normal rate and regular rhythm.     Pulses: Normal pulses.     Heart sounds: Normal heart sounds.  No murmur heard.    No friction rub. No gallop.  Pulmonary:     Effort: Pulmonary effort is normal.     Breath sounds: Normal breath sounds. No wheezing, rhonchi or rales.  Abdominal:     General: Abdomen is flat. Bowel sounds are normal. There is no distension.     Palpations: Abdomen is soft.     Tenderness: There is no abdominal tenderness.  Musculoskeletal:        General: No swelling. Normal range of motion.     Cervical back: Normal range of motion.     Right lower leg: No edema.     Left lower leg: No edema.  Lymphadenopathy:     Cervical: No cervical adenopathy.  Skin:    General: Skin is warm and dry.     Capillary Refill: Capillary  refill takes less than 2 seconds.     Coloration: Skin is not jaundiced.  Neurological:     General: No focal deficit present.     Mental Status: She is alert and oriented to person, place, and time.  Psychiatric:        Mood and Affect: Mood normal.        Behavior: Behavior normal.   Last CBC Lab Results  Component Value Date   WBC 7.8 01/03/2023   HGB 12.7 01/03/2023   HCT 38.8 01/03/2023   MCV 90.4 01/03/2023   MCH 29.6 01/03/2023   RDW 12.6 01/03/2023   PLT 204 01/03/2023   Last metabolic panel Lab Results  Component Value Date   GLUCOSE 138 (H) 10/03/2023   NA 141 10/03/2023   K 4.1 10/03/2023   CL 104 10/03/2023   CO2 23 10/03/2023   BUN 12 10/03/2023   CREATININE 0.64 10/03/2023   EGFR 97 10/03/2023   CALCIUM 9.0 10/03/2023   PROT 6.0 10/03/2023   ALBUMIN 4.3 10/03/2023   LABGLOB 1.7 10/03/2023   BILITOT 0.3 10/03/2023   ALKPHOS 105 10/03/2023   AST 21 10/03/2023   ALT 20 10/03/2023   ANIONGAP 9 01/03/2023   Last lipids Lab Results  Component Value Date   CHOL 156 10/03/2023   HDL 51 10/03/2023   LDLCALC 90 10/03/2023   TRIG 76 10/03/2023   CHOLHDL 3.1 10/03/2023   Last hemoglobin A1c Lab Results  Component Value Date   HGBA1C 7.9 (A) 09/29/2023   Last thyroid functions Lab Results  Component Value Date   TSH 0.461 10/03/2023   Last vitamin D Lab Results  Component Value Date   VD25OH 25.7 (L) 10/03/2023   The 10-year ASCVD risk score (Arnett DK, et al., 2019) is: 9.2%    Assessment & Plan:   Problem List Items Addressed This Visit       Hyperlipidemia associated with type 2 diabetes mellitus (HCC)    Lipid panel updated in October.  Total cholesterol 156 and LDL 90.  Atorvastatin 10 mg daily has been prescribed today.  Repeat lipid panel at follow-up in 3 months.      Seronegative arthritis    Previously referred to rheumatology in the setting of seronegative RA.  She was prescribed hydroxychloroquine by her previous PCP but  reports today that she has stopped taking it. -Rheumatology appointment is scheduled for March 2025      MDD (major depressive disorder)    She has recently stopped taking Lexapro due to concern for efficacy.  PHQ-9 score today is 5.  As otherwise documented, Cymbalta 30 mg daily has been started today  for treatment of both chronic musculoskeletal pain and MDD.  Okay to increase to 60 mg daily after 1 week.      Chronic pain syndrome - Primary    She endorses chronic musculoskeletal pain in the setting of seronegative RA and diffuse osteoarthritis.  Pain is currently managed with hydrocodone-acetaminophen 10-325 mg 3 times daily as needed as well as Lyrica 75 mg 3 times daily.  PDMP reviewed and remains appropriate. -Through shared decision making, Cymbalta 30 mg daily has been prescribed for treatment of both chronic musculoskeletal pain and MDD.  Okay to increase to 60 mg daily after 1 week. -Continue hydrocodone-acetaminophen and Lyrica as currently prescribed.      Insomnia    She endorses recent difficulty sleeping.  Likely due to chronic musculoskeletal pain.  Adding Cymbalta today as otherwise documented.  I have also prescribed hydroxyzine 25 mg nightly as needed for insomnia.  She states that she has previously tried trazodone, Ambien, and benzodiazepines and felt that these medications were "too strong".      Vitamin D insufficiency    Noted on right labs.  She has started high-dose, weekly vitamin D supplementation as prescribed per endocrinology.      Need for influenza vaccination    Influenza vaccine administered today      Return in about 3 months (around 02/08/2024).   Billie Lade, MD

## 2023-11-10 NOTE — Patient Instructions (Signed)
It was a pleasure to see you today.  Thank you for giving [place] the opportunity to be involved in your care.  Below is a brief recap of your visit and next steps.  We will plan to see you again in 3 months.  Summary Start Cymbalta from chronic musculoskeletal pain and depression Start atorvastatin for high cholesterol Try hydroxyzine 25 mg nightly as needed for sleep Follow up in 3 months Flu shot today

## 2023-11-10 NOTE — Assessment & Plan Note (Signed)
Influenza vaccine administered today.

## 2023-11-10 NOTE — Assessment & Plan Note (Signed)
She has recently stopped taking Lexapro due to concern for efficacy.  PHQ-9 score today is 5.  As otherwise documented, Cymbalta 30 mg daily has been started today for treatment of both chronic musculoskeletal pain and MDD.  Okay to increase to 60 mg daily after 1 week.

## 2023-11-10 NOTE — Assessment & Plan Note (Signed)
Noted on right labs.  She has started high-dose, weekly vitamin D supplementation as prescribed per endocrinology.

## 2023-11-10 NOTE — Assessment & Plan Note (Signed)
She endorses chronic musculoskeletal pain in the setting of seronegative RA and diffuse osteoarthritis.  Pain is currently managed with hydrocodone-acetaminophen 10-325 mg 3 times daily as needed as well as Lyrica 75 mg 3 times daily.  PDMP reviewed and remains appropriate. -Through shared decision making, Cymbalta 30 mg daily has been prescribed for treatment of both chronic musculoskeletal pain and MDD.  Okay to increase to 60 mg daily after 1 week. -Continue hydrocodone-acetaminophen and Lyrica as currently prescribed.

## 2023-11-10 NOTE — Assessment & Plan Note (Signed)
She endorses recent difficulty sleeping.  Likely due to chronic musculoskeletal pain.  Adding Cymbalta today as otherwise documented.  I have also prescribed hydroxyzine 25 mg nightly as needed for insomnia.  She states that she has previously tried trazodone, Ambien, and benzodiazepines and felt that these medications were "too strong".

## 2023-11-10 NOTE — Assessment & Plan Note (Signed)
Lipid panel updated in October.  Total cholesterol 156 and LDL 90.  Atorvastatin 10 mg daily has been prescribed today.  Repeat lipid panel at follow-up in 3 months.

## 2023-11-10 NOTE — Assessment & Plan Note (Signed)
Previously referred to rheumatology in the setting of seronegative RA.  She was prescribed hydroxychloroquine by her previous PCP but reports today that she has stopped taking it. -Rheumatology appointment is scheduled for March 2025

## 2023-11-26 ENCOUNTER — Other Ambulatory Visit: Payer: Self-pay | Admitting: Internal Medicine

## 2023-11-26 DIAGNOSIS — G894 Chronic pain syndrome: Secondary | ICD-10-CM

## 2023-11-26 MED ORDER — HYDROCODONE-ACETAMINOPHEN 10-325 MG PO TABS: 1.0000 | ORAL_TABLET | Freq: Three times a day (TID) | ORAL | 0 refills | Status: DC | PRN

## 2023-12-29 ENCOUNTER — Other Ambulatory Visit: Payer: Self-pay | Admitting: Internal Medicine

## 2023-12-29 DIAGNOSIS — G894 Chronic pain syndrome: Secondary | ICD-10-CM

## 2023-12-30 ENCOUNTER — Other Ambulatory Visit: Payer: Self-pay | Admitting: Internal Medicine

## 2023-12-30 ENCOUNTER — Telehealth: Payer: Self-pay | Admitting: *Deleted

## 2023-12-30 NOTE — Telephone Encounter (Signed)
Patient called to see if she had lab work prior to her ov on Thursday. She said, that she may have jumped the gun and went and got them done after her last office visit. This is what I see. Patient advised that we will check with Whitney to be sure , and I would call her back in the morning.

## 2023-12-31 NOTE — Telephone Encounter (Signed)
Patient was called and made aware. 

## 2023-12-31 NOTE — Telephone Encounter (Signed)
She's fine, she does not need to repeat any labs.

## 2024-01-01 ENCOUNTER — Ambulatory Visit (INDEPENDENT_AMBULATORY_CARE_PROVIDER_SITE_OTHER): Payer: Medicare Other | Admitting: Nurse Practitioner

## 2024-01-01 ENCOUNTER — Encounter: Payer: Self-pay | Admitting: Nurse Practitioner

## 2024-01-01 VITALS — BP 121/71 | HR 76 | Ht 68.0 in | Wt 167.2 lb

## 2024-01-01 DIAGNOSIS — E559 Vitamin D deficiency, unspecified: Secondary | ICD-10-CM | POA: Diagnosis not present

## 2024-01-01 DIAGNOSIS — E1165 Type 2 diabetes mellitus with hyperglycemia: Secondary | ICD-10-CM | POA: Diagnosis not present

## 2024-01-01 DIAGNOSIS — Z7984 Long term (current) use of oral hypoglycemic drugs: Secondary | ICD-10-CM | POA: Diagnosis not present

## 2024-01-01 LAB — POCT GLYCOSYLATED HEMOGLOBIN (HGB A1C): Hemoglobin A1C: 8.2 % — AB (ref 4.0–5.6)

## 2024-01-01 MED ORDER — VITAMIN D (ERGOCALCIFEROL) 1.25 MG (50000 UNIT) PO CAPS: 50000.0000 [IU] | ORAL_CAPSULE | ORAL | 0 refills | Status: DC

## 2024-01-01 MED ORDER — SITAGLIPTIN PHOSPHATE 100 MG PO TABS: 100.0000 mg | ORAL_TABLET | Freq: Every day | ORAL | 1 refills | Status: DC

## 2024-01-01 NOTE — Progress Notes (Signed)
Endocrinology Follow Up Note       01/01/2024, 3:23 PM   Subjective:    Patient ID: [name], female    DOB: 09-May-1956.  [name] is being seen in follow up after being seen in consultation for management of currently uncontrolled symptomatic diabetes requested by  Billie Lade, MD.   Past Medical History:  Diagnosis Date   Arthritis    Diabetes mellitus without complication (HCC)    PONV (postoperative nausea and vomiting)    Pulmonary embolism (HCC) 2005    Past Surgical History:  Procedure Laterality Date   ABDOMINAL HYSTERECTOMY     CARPAL TUNNEL RELEASE Left 2000   CARPAL TUNNEL RELEASE Right 2000   CATARACT EXTRACTION W/PHACO Right 11/21/2014   Procedure: CATARACT EXTRACTION PHACO AND INTRAOCULAR LENS PLACEMENT (IOC);  Surgeon: Gemma Payor, MD;  Location: [facility];  Service: Ophthalmology;  Laterality: Right;  CDE:5.78   TOTAL KNEE ARTHROPLASTY Right 01/13/2017   Procedure: RIGHT TOTAL KNEE ARTHROPLASTY;  Surgeon: Ollen Gross, MD;  Location: [facility];  Service: Orthopedics;  Laterality: Right;  Adductor Block    Social History   Socioeconomic History   Marital status: Married    Spouse name: Not on file   Number of children: Not on file   Years of education: Not on file   Highest education level: Not on file  Occupational History   Not on file  Tobacco Use   Smoking status: Never   Smokeless tobacco: Never  Vaping Use   Vaping status: Never Used  Substance and Sexual Activity   Alcohol use: No   Drug use: No   Sexual activity: Not on file  Other Topics Concern   Not on file  Social History Narrative   Not on file   Social Drivers of Health   Financial Resource Strain: Not on file  Food Insecurity: Not on file  Transportation Needs: Not on file  Physical Activity: Not on file  Stress: Not on file  Social Connections: Not on file    History reviewed. No pertinent  family history.  Outpatient Encounter Medications as of 01/01/2024  Medication Sig   albuterol (PROVENTIL HFA;VENTOLIN HFA) 108 (90 Base) MCG/ACT inhaler Inhale 2 puffs into the lungs every 6 (six) hours as needed for wheezing or shortness of breath.   atorvastatin (LIPITOR) 10 MG tablet Take 1 tablet (10 mg total) by mouth daily.   cetirizine (ZYRTEC) 10 MG tablet Take 10 mg by mouth daily.   diclofenac (VOLTAREN) 75 MG EC tablet Take 75 mg by mouth 2 (two) times daily.   diclofenac Sodium (VOLTAREN ARTHRITIS PAIN) 1 % GEL Apply 2 g topically 4 (four) times daily.   DULoxetine (CYMBALTA) 30 MG capsule Take 1 capsule (30 mg total) by mouth daily.   fluticasone (FLONASE) 50 MCG/ACT nasal spray Place 1 spray into both nostrils daily as needed for allergies.    glucose blood (CONTOUR NEXT TEST) test strip Use as instructed to monitor glucose once daily   HYDROcodone-acetaminophen (NORCO) 10-325 MG tablet Take 1 tablet by mouth 3 (three) times daily as needed for severe pain (pain score 7-10).   hydrOXYzine (VISTARIL) 25 MG capsule Take  1 capsule (25 mg total) by mouth every 8 (eight) hours as needed.   metFORMIN (GLUCOPHAGE) 500 MG tablet Take 1 tablet (500 mg total) by mouth 2 (two) times daily with a meal.   montelukast (SINGULAIR) 10 MG tablet Take 10 mg by mouth at bedtime.   omeprazole (PRILOSEC) 40 MG capsule Take 40 mg by mouth every morning.   ondansetron (ZOFRAN) 4 MG tablet Take 4 mg by mouth every 8 (eight) hours as needed.   pregabalin (LYRICA) 75 MG capsule TAKE ONE CAPSULE BY MOUTH 3 TIMES A DAY FOR NERVES   [DISCONTINUED] sitaGLIPtin (JANUVIA) 50 MG tablet Take 1 tablet (50 mg total) by mouth daily.   [DISCONTINUED] Vitamin D, Ergocalciferol, (DRISDOL) 1.25 MG (50000 UNIT) CAPS capsule Take 1 capsule (50,000 Units total) by mouth every 7 (seven) days.   sitaGLIPtin (JANUVIA) 100 MG tablet Take 1 tablet (100 mg total) by mouth daily.   Vitamin D, Ergocalciferol, (DRISDOL) 1.25 MG  (50000 UNIT) CAPS capsule Take 1 capsule (50,000 Units total) by mouth every 7 (seven) days.   [DISCONTINUED] pregabalin (LYRICA) 75 MG capsule Take 75 mg by mouth 3 (three) times daily.   No facility-administered encounter medications on file as of 01/01/2024.    ALLERGIES: Allergies  Allergen Reactions   Aspirin Other (See Comments)    "burns my stomach"    VACCINATION STATUS: Immunization History  Administered Date(s) Administered   Fluad Trivalent(High Dose 67+) 11/10/2023   Rabies, IM 01/11/2023   Tdap 06/28/2015, 01/11/2023    Diabetes She presents for her follow-up diabetic visit. She has type 2 diabetes mellitus. Onset time: diagnosed at approx age of 33. Her disease course has been worsening. There are no hypoglycemic associated symptoms. Associated symptoms include fatigue. Pertinent negatives for diabetes include no polydipsia and no weight loss. There are no hypoglycemic complications. Symptoms are stable. Diabetic complications include heart disease. Risk factors for coronary artery disease include diabetes mellitus, dyslipidemia and family history. Current diabetic treatment includes oral agent (monotherapy). She is compliant with treatment most of the time. Her weight is fluctuating minimally. She is following a generally healthy diet. When asked about meal planning, she reported none. She has not had a previous visit with a dietitian. She participates in exercise intermittently. Her home blood glucose trend is increasing steadily. Her overall blood glucose range is 180-200 mg/dl. (She presents today with her meter, no logs, showing slightly above target glycemic profile.  Her POCT A1c today is 8.2%, increasing from last visit of 7.9%.  She has readings ranging from 114-312 (notes she has not eaten anything the day her glucose was 312) in her meter.  She has consultation with Rheumatology in March.  She stays in pain constantly.) She does not see a podiatrist.Eye exam is current.     Review of systems  Constitutional: +increasing body weight,  current Body mass index is 25.42 kg/m. , no fatigue, no subjective hyperthermia, no subjective hypothermia, + increased appetite Eyes: no blurry vision, no xerophthalmia ENT: no sore throat, no nodules palpated in throat, no dysphagia/odynophagia, no hoarseness Cardiovascular: no chest pain, no shortness of breath, no palpitations, no leg swelling Respiratory: no cough, no shortness of breath Gastrointestinal: no nausea/vomiting/diarrhea Musculoskeletal: + diffuse muscle/joint aches (seeing Rheumatology in March) Skin: no rashes, no hyperemia Neurological: no tremors, no numbness, no tingling, no dizziness Psychiatric: no depression, no anxiety  Objective:     BP 121/71 (BP Location: Left Arm, Patient Position: Sitting, Cuff Size: Large)   Pulse 76  Ht 5\' 8"  (1.727 m)   Wt 167 lb 3.2 oz (75.8 kg)   BMI 25.42 kg/m   Wt Readings from Last 3 Encounters:  01/01/24 167 lb 3.2 oz (75.8 kg)  11/10/23 157 lb 12.8 oz (71.6 kg)  09/29/23 153 lb 9.6 oz (69.7 kg)     BP Readings from Last 3 Encounters:  01/01/24 121/71  11/10/23 118/74  09/29/23 109/66      Physical Exam- Limited  Constitutional:  Body mass index is 25.42 kg/m. , not in acute distress, normal state of mind Eyes:  EOMI, no exophthalmos Musculoskeletal: no gross deformities, strength intact in all four extremities, no gross restriction of joint movements Skin:  no rashes, no hyperemia Neurological: no tremor with outstretched hands   Diabetic Foot Exam - Simple   No data filed     CMP ( most recent) CMP     Component Value Date/Time   NA 141 10/03/2023 1359   K 4.1 10/03/2023 1359   CL 104 10/03/2023 1359   CO2 23 10/03/2023 1359   GLUCOSE 138 (H) 10/03/2023 1359   GLUCOSE 147 (H) 01/03/2023 1405   BUN 12 10/03/2023 1359   CREATININE 0.64 10/03/2023 1359   CALCIUM 9.0 10/03/2023 1359   PROT 6.0 10/03/2023 1359   ALBUMIN 4.3  10/03/2023 1359   AST 21 10/03/2023 1359   ALT 20 10/03/2023 1359   ALKPHOS 105 10/03/2023 1359   BILITOT 0.3 10/03/2023 1359   EGFR 97 10/03/2023 1359   GFRNONAA >60 01/03/2023 1405     Diabetic Labs (most recent): Lab Results  Component Value Date   HGBA1C 8.2 (A) 01/01/2024   HGBA1C 7.9 (A) 09/29/2023   HGBA1C 6.7 (A) 06/26/2023   MICROALBUR 30 mg/L 09/29/2023     Lipid Panel ( most recent) Lipid Panel     Component Value Date/Time   CHOL 156 10/03/2023 1359   TRIG 76 10/03/2023 1359   HDL 51 10/03/2023 1359   CHOLHDL 3.1 10/03/2023 1359   LDLCALC 90 10/03/2023 1359   LABVLDL 15 10/03/2023 1359      Lab Results  Component Value Date   TSH 0.461 10/03/2023   TSH 0.26 (A) 02/18/2023   FREET4 1.32 10/03/2023           Assessment & Plan:   1) Type 2 diabetes mellitus with hyperglycemia, without long-term current use of insulin (HCC)  She presents today with her meter, no logs, showing slightly above target glycemic profile.  Her POCT A1c today is 8.2%, increasing from last visit of 7.9%.  She has readings ranging from 114-312 (notes she has not eaten anything the day her glucose was 312) in her meter.  She has consultation with Rheumatology in March.  She stays in pain constantly.  - DJENABA MARIA has currently uncontrolled symptomatic type 2 DM since 68 years of age.   -Recent labs reviewed.  - I had a long discussion with her about the progressive nature of diabetes and the pathology behind its complications. -her diabetes is complicated by CAD and chronic intermittent use of steroids and she remains at a high risk for more acute and chronic complications which include CAD, CVA, CKD, retinopathy, and neuropathy. These are all discussed in detail with her.  The following Lifestyle Medicine recommendations according to American College of Lifestyle Medicine [facility]) were discussed and offered to patient and she agrees to start the journey:  A. Whole Foods,  Plant-based plate comprising of fruits and vegetables, plant-based proteins, whole-grain  carbohydrates was discussed in detail with the patient.   A list for source of those nutrients were also provided to the patient.  Patient will use only water or unsweetened tea for hydration. B.  The need to stay away from risky substances including alcohol, smoking; obtaining 7 to 9 hours of restorative sleep, at least 150 minutes of moderate intensity exercise weekly, the importance of healthy social connections,  and stress reduction techniques were discussed. C.  A full color page of  Calorie density of various food groups per pound showing examples of each food groups was provided to the patient.  - Nutritional counseling repeated at each appointment due to patients tendency to fall back in to old habits.  - The patient admits there is a room for improvement in their diet and drink choices. -  Suggestion is made for the patient to avoid simple carbohydrates from their diet including Cakes, Sweet Desserts / Pastries, Ice Cream, Soda (diet and regular), Sweet Tea, Candies, Chips, Cookies, Sweet Pastries, Store Bought Juices, Alcohol in Excess of 1-2 drinks a day, Artificial Sweeteners, Coffee Creamer, and "Sugar-free" Products. This will help patient to have stable blood glucose profile and potentially avoid unintended weight gain.   - I encouraged the patient to switch to unprocessed or minimally processed complex starch and increased protein intake (animal or plant source), fruits, and vegetables.   - Patient is advised to stick to a routine mealtimes to eat 3 meals a day and avoid unnecessary snacks (to snack only to correct hypoglycemia).  - I have approached her with the following individualized plan to manage her diabetes and patient agrees:   -She is advised to continue Metformin 500 mg po twice daily after meals (cannot tolerate higher doses due to GI SE), therapeutically suitable for patient.  I did  increase her Januvia to 100 mg po daily to help with prandial spikes.  We talked about proper diet again today and eating on a routine schedule for metabolism purposes.  -she is encouraged to continue monitoring glucose once daily, before breakfast, and to call the clinic if she has readings less than 70 or above 300 for 3 tests in a row.  - Adjustment parameters are given to her for hypo and hyperglycemia in writing.  - her Ozempic was previously discontinued, risk outweighs benefit for this patient.  she is not an ideal candidate for incretin therapy given body habitus and BMI <25.   - Specific targets for  A1c; LDL, HDL, and Triglycerides were discussed with the patient.  2) Blood Pressure /Hypertension:  her blood pressure is controlled to target without the use of antihypertensive medications.    3) Lipids/Hyperlipidemia:    Review of her recent lipid panel from 10/03/23 showed controlled LDL at 90 .  She is not currently on any lipid lowering medications at this time.    4)  Weight/Diet:  her Body mass index is 25.42 kg/m.  -  she is NOT a candidate for weight loss. I discussed with her the fact that loss of 5 - 10% of her  current body weight will have the most impact on her diabetes management.  Exercise, and detailed carbohydrates information provided  -  detailed on discharge instructions.  5) Chronic Care/Health Maintenance: -she is not on ACEI/ARB or Statin medications and is encouraged to initiate and continue to follow up with Ophthalmology, Dentist, Podiatrist at least yearly or according to recommendations, and advised to stay away from smoking. I have recommended yearly  flu vaccine and pneumonia vaccine at least every 5 years; moderate intensity exercise for up to 150 minutes weekly; and sleep for at least 7 hours a day.  6) Abnormal TSH-resolved Her repeat thyroid labs were normal.  Thyroid antibodies were negative, ruling out autoimmune thyroid  dysfunction.  7)Fatigue Her recent vitamin D level was 25.7 on 10/03/23.  She was started on Ergocalciferol.  She notes this has helped with her energy level some.   - she is advised to maintain close follow up with Durwin Nora Lucina Mellow, MD for primary care needs, as well as her other providers for optimal and coordinated care.     I spent  34  minutes in the care of the patient today including review of labs from CMP, Lipids, Thyroid Function, Hematology (current and previous including abstractions from other facilities); face-to-face time discussing  her blood glucose readings/logs, discussing hypoglycemia and hyperglycemia episodes and symptoms, medications doses, her options of short and long term treatment based on the latest standards of care / guidelines;  discussion about incorporating lifestyle medicine;  and documenting the encounter. Risk reduction counseling performed per USPSTF guidelines to reduce obesity and cardiovascular risk factors.     Please refer to Patient Instructions for Blood Glucose Monitoring and Insulin/Medications Dosing Guide"  in media tab for additional information. Please  also refer to " Patient Self Inventory" in the Media  tab for reviewed elements of pertinent patient history.  Westly Pam participated in the discussions, expressed understanding, and voiced agreement with the above plans.  All questions were answered to her satisfaction. she is encouraged to contact clinic should she have any questions or concerns prior to her return visit.     Follow up plan: - Return in about 3 months (around 03/31/2024) for Diabetes F/U with A1c in office, No previsit labs.   Ronny Bacon, [facility] [address] [place], [place] [address] Phone: [phone] Fax: [phone]  01/01/2024, 3:23 PM

## 2024-01-29 ENCOUNTER — Other Ambulatory Visit: Payer: Self-pay | Admitting: Internal Medicine

## 2024-01-29 DIAGNOSIS — G894 Chronic pain syndrome: Secondary | ICD-10-CM

## 2024-02-08 NOTE — Progress Notes (Addendum)
 Office Visit Note  Patient: [name]             Date of Birth: [birth year]           MRN: [id number]             PCP: Billie Lade, MD Referring: Billie Lade, MD Visit Date: 02/09/2024   Subjective:  New Patient (Initial Visit) (Patient states she is having pain in her hips, shoulders, wrist, and neck. Patient states her feet are not working like they are supposed to. Patient states she does have rashes. )   Discussed the use of AI scribe software for clinical note transcription with the patient, who gave verbal consent to proceed.  History of Present Illness   [name] is a 68 year old female with history of rheumatoid arthritis who presents with worsening joint pain and difficulty managing symptoms.  She has a history of rheumatoid arthritis and has been taking hydroxychloroquine (Plaquenil) for years. Recently, she stopped taking it due to a change in doctors and feels worse without it. Her body feels 'sore', and she has difficulty getting started in the morning, with symptoms worsening over time. She experiences significant pain in her shoulders, hips, back, and neck, affecting her sleep, causing her to toss and turn due to discomfort. She also reports pain in her wrists and knees, with a history of a knee fracture two years ago that healed on its own. Her hip pain has been severe for about six months, and she has not had it x-rayed yet.  She has a history of a torn rotator cuff and neck issues, with x-rays done on her neck. She feels a 'crunchy' sensation in her neck and back. She has been diagnosed with a partial rotator cuff tear and has received shoulder injections, which she found helpful. 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 She takes Lyrica, which she finds essential for managing her shoulder pain, but has difficulty getting it prescribed consistently.  She has been told she has  fibromyalgia in the past and finds Lyrica helpful for this condition as well. She experiences muscle weakness and was found to have low vitamin D levels, which improved with supplementation. She takes Voltaren, which helps with pain but causes sleepiness and stomach issues, requiring her to take nausea medication first. She takes it only when desperate, as it provides significant relief despite the side effects.  She has diabetes, which she finds difficult to control. Her diabetes doctor suggested that lack of exercise or pain might be contributing factors. Despite this, she is physically active, working daily on a farm. She experiences twitching and jerking at night, and her hands and feet sometimes feel 'funny' or don't 'get the message' to function properly. No significant swelling except for a past episode during the COVID shutdown, which was painful.  She has not had a bone density scan but believes her bones are strong due to lifelong milk consumption. She has not been diagnosed with osteoporosis. She reports that her leg was injured by a calf, resulting in a fracture that was painful but healed without significant intervention.     Activities of Daily Living:  Patient reports morning stiffness for 10 minutes.   Patient Reports nocturnal pain.  Difficulty dressing/grooming: Denies Difficulty climbing stairs: Denies Difficulty getting out of chair: Reports Difficulty using hands for taps, buttons, cutlery, and/or writing: Reports  Review of Systems  Constitutional:  Positive for fatigue.  HENT:  Positive for mouth dryness. Negative for mouth sores.   Eyes:  Positive for dryness.  Respiratory:  Positive for shortness of breath.   Cardiovascular:  Positive for chest pain. Negative for palpitations.  Gastrointestinal:  Negative for blood in stool, constipation and diarrhea.  Endocrine: Positive for increased urination.  Genitourinary:  Positive for involuntary urination.  Musculoskeletal:   Positive for joint pain, gait problem, joint pain, myalgias, muscle weakness, morning stiffness, muscle tenderness and myalgias. Negative for joint swelling.  Skin:  Positive for color change, rash and sensitivity to sunlight. Negative for hair loss.  Allergic/Immunologic: Positive for susceptible to infections.  Neurological:  Positive for headaches. Negative for dizziness.  Hematological:  Negative for swollen glands.  Psychiatric/Behavioral:  Positive for depressed mood and sleep disturbance. The patient is not nervous/anxious.     PMFS History:  Patient Active Problem List   Diagnosis Date Noted   Persistent cough 02/11/2024   Insomnia 11/10/2023   Vitamin D insufficiency 11/10/2023   Need for influenza vaccination 11/10/2023   Type 2 diabetes mellitus with hyperglycemia (HCC) 08/05/2023   Seronegative arthritis 08/05/2023   Allergic rhinitis 08/05/2023   MDD (major depressive disorder) 08/05/2023   Chronic pain syndrome 08/05/2023   Hyperlipidemia associated with type 2 diabetes mellitus (HCC) 08/05/2023   Breast cancer screening by mammogram 08/05/2023   OA (osteoarthritis) of knee 01/13/2017    Past Medical History:  Diagnosis Date   Arthritis    Diabetes mellitus without complication (HCC)    PONV (postoperative nausea and vomiting)    Pulmonary embolism (HCC) 2005    Family History  Problem Relation Age of Onset   Cancer Mother    Liver disease Brother    Diabetes Brother    Diabetes Brother    Past Surgical History:  Procedure Laterality Date   ABDOMINAL HYSTERECTOMY     CARPAL TUNNEL RELEASE Left 2000   CARPAL TUNNEL RELEASE Right 2000   CATARACT EXTRACTION W/PHACO Right 11/21/2014   Procedure: CATARACT EXTRACTION PHACO AND INTRAOCULAR LENS PLACEMENT (IOC);  Surgeon: Gemma Payor, MD;  Location: [facility];  Service: Ophthalmology;  Laterality: Right;  CDE:5.78   TOTAL KNEE ARTHROPLASTY Right 01/13/2017   Procedure: RIGHT TOTAL KNEE ARTHROPLASTY;  Surgeon: Ollen Gross, MD;  Location: [facility];  Service: Orthopedics;  Laterality: Right;  Adductor Block   Social History   Social History Narrative   Not on file   Immunization History  Administered Date(s) Administered   Fluad Trivalent(High Dose 65+) 11/10/2023   Rabies, IM 01/11/2023   Tdap 06/28/2015, 01/11/2023     Objective: Vital Signs: BP 111/71 (BP Location: Right Arm, Patient Position: Sitting, Cuff Size: Normal)   Pulse 79   Resp 14   Ht 5' 6.5" (1.689 m)   Wt 155 lb (70.3 kg)   BMI 24.64 kg/m    Physical Exam HENT:     Mouth/Throat:     Mouth: Mucous membranes are moist.     Pharynx: Oropharynx is clear.  Eyes:     Conjunctiva/sclera: Conjunctivae normal.  Cardiovascular:     Rate and Rhythm: Normal rate and regular rhythm.  Pulmonary:     Effort: Pulmonary effort is normal.     Breath sounds: Normal breath sounds.  Musculoskeletal:     Right lower leg: No edema.     Left lower leg: No edema.  Lymphadenopathy:     Cervical: No cervical adenopathy.  Skin:    General: Skin is warm and  dry.     Findings: No rash.  Neurological:     Mental Status: She is alert.  Psychiatric:        Mood and Affect: Mood normal.     Musculoskeletal Exam:  Shoulders limited overhead abduction in active ROM, tenderness to pressure, no palpable effusion Elbows full ROM no tenderness or swelling Wrists full ROM no tenderness or swelling Fingers heberdon's nodes b/l hands, tenderness to pressure without palpable synovitis, grip strength normal Lateral hip tenderness to pressure, fair ROM without hard endpoints, no anterior pain with internal rotation, no radiation Left knee anterior and joint line tenderness to pressure, patellofemoral crepitus, no palpable effusion Ankles full ROM no tenderness or swelling MTPs full ROM no tenderness or swelling   Investigation: No additional findings.  Imaging: No results found.  Recent Labs: Lab Results  Component Value Date   WBC 7.8  01/03/2023   HGB 12.7 01/03/2023   PLT 204 01/03/2023   NA 141 10/03/2023   K 4.1 10/03/2023   CL 104 10/03/2023   CO2 23 10/03/2023   GLUCOSE 138 (H) 10/03/2023   BUN 12 10/03/2023   CREATININE 0.64 10/03/2023   BILITOT 0.3 10/03/2023   ALKPHOS 105 10/03/2023   AST 21 10/03/2023   ALT 20 10/03/2023   PROT 6.0 10/03/2023   ALBUMIN 4.3 10/03/2023   CALCIUM 9.0 10/03/2023   GFRAA >60 01/15/2017    Speciality Comments: No specialty comments available.  Procedures:  No procedures performed Allergies: Aspirin   Assessment / Plan:     Visit Diagnoses: Seronegative rheumatoid arthritis - Plan: XR Hand 2 View Right, XR Hand 2 View Left, Sedimentation rate, C-reactive protein, Rheumatoid factor, Cyclic citrul peptide antibody, IgG, ANA Screen,IFA,Reflex Titer/Pattern,Reflex Mplx 11 Ab Cascade with IdentRA Patient reports increased pain and discomfort since discontinuing hydroxychloroquine. Reports difficulty with mobility, particularly in the morning, and pain that disrupts sleep. No definite peripheral synovitis on exam with significant pain but degenerative changes throughout. -Low threshold to resume HCQ pending results reviewed -Order blood tests as detailed to assess inflammation levels and potential need for rheumatoid arthritis-specific medication. -Xray hands, left knee, b/l hips  Osteoarthritis of knee, unspecified laterality, unspecified osteoarthritis type - Plan: XR KNEE 3 VIEW LEFT Patient reports pain in multiple joints, including shoulders, wrists, hips, and knees. History of rotator cuff tear and knee replacement. Recent increase in hip pain over the past six months. -Order x-rays of affected joints to assess severity of osteoarthritis and potential need for interventions such as injections or physical therapy.  Vitamin D insufficiency -Continue Vitamin D supplementation due to previous deficiency and potential for deficiency due to limited sun exposure.  Pain in left  hip - Plan: XR HIP UNILAT W OR W/O PELVIS 2-3 VIEWS LEFT, cyclobenzaprine (FLEXERIL) 10 MG tablet Myofascial Pain Syndrome Patient reports widespread muscle pain and soreness, with some relief from Lyrica. -Agree with continued Lyrica as tolerated by the patient. -Prescribe flexeril 10 mg at bedtime to help manage nighttime pain and improve sleep quality.    Orders: Orders Placed This Encounter  Procedures   XR Hand 2 View Right   XR Hand 2 View Left   XR HIP UNILAT W OR W/O PELVIS 2-3 VIEWS LEFT   XR KNEE 3 VIEW LEFT   Sedimentation rate   C-reactive protein   Rheumatoid factor   Cyclic citrul peptide antibody, IgG   ANA Screen,IFA,Reflex Titer/Pattern,Reflex Mplx 11 Ab Cascade with IdentRA   Meds ordered this encounter  Medications  cyclobenzaprine (FLEXERIL) 10 MG tablet    Sig: Take 1 tablet (10 mg total) by mouth at bedtime as needed.    Dispense:  30 tablet    Refill:  0    Follow-Up Instructions: Return in about 4 weeks (around 03/08/2024) for New pt ?RA/OA f/u 5mo.   Fuller Plan, MD  Note - This record has been created using [employer].  Chart creation errors have been sought, but may not always  have been located. Such creation errors do not reflect on  the standard of medical care.

## 2024-02-09 ENCOUNTER — Ambulatory Visit: Payer: Medicare Other | Attending: Internal Medicine | Admitting: Internal Medicine

## 2024-02-09 ENCOUNTER — Encounter: Payer: Self-pay | Admitting: Internal Medicine

## 2024-02-09 ENCOUNTER — Ambulatory Visit

## 2024-02-09 VITALS — BP 111/71 | HR 79 | Resp 14 | Ht 66.5 in | Wt 155.0 lb

## 2024-02-09 DIAGNOSIS — M1380 Other specified arthritis, unspecified site: Secondary | ICD-10-CM | POA: Insufficient documentation

## 2024-02-09 DIAGNOSIS — E559 Vitamin D deficiency, unspecified: Secondary | ICD-10-CM | POA: Insufficient documentation

## 2024-02-09 DIAGNOSIS — M25562 Pain in left knee: Secondary | ICD-10-CM

## 2024-02-09 DIAGNOSIS — M79641 Pain in right hand: Secondary | ICD-10-CM

## 2024-02-09 DIAGNOSIS — M79642 Pain in left hand: Secondary | ICD-10-CM | POA: Diagnosis not present

## 2024-02-09 DIAGNOSIS — M25552 Pain in left hip: Secondary | ICD-10-CM

## 2024-02-09 DIAGNOSIS — M179 Osteoarthritis of knee, unspecified: Secondary | ICD-10-CM

## 2024-02-09 MED ORDER — CYCLOBENZAPRINE HCL 10 MG PO TABS: 10.0000 mg | ORAL_TABLET | Freq: Every evening | ORAL | 0 refills | Status: DC | PRN

## 2024-02-11 ENCOUNTER — Ambulatory Visit (INDEPENDENT_AMBULATORY_CARE_PROVIDER_SITE_OTHER): Payer: Medicare Other | Admitting: Internal Medicine

## 2024-02-11 ENCOUNTER — Encounter: Payer: Self-pay | Admitting: Internal Medicine

## 2024-02-11 VITALS — BP 118/75 | HR 83 | Ht 66.5 in | Wt 159.4 lb

## 2024-02-11 DIAGNOSIS — G894 Chronic pain syndrome: Secondary | ICD-10-CM

## 2024-02-11 DIAGNOSIS — R058 Other specified cough: Secondary | ICD-10-CM

## 2024-02-11 DIAGNOSIS — E1169 Type 2 diabetes mellitus with other specified complication: Secondary | ICD-10-CM

## 2024-02-11 DIAGNOSIS — F5104 Psychophysiologic insomnia: Secondary | ICD-10-CM

## 2024-02-11 DIAGNOSIS — E1165 Type 2 diabetes mellitus with hyperglycemia: Secondary | ICD-10-CM

## 2024-02-11 DIAGNOSIS — R053 Chronic cough: Secondary | ICD-10-CM

## 2024-02-11 DIAGNOSIS — E785 Hyperlipidemia, unspecified: Secondary | ICD-10-CM

## 2024-02-11 DIAGNOSIS — M1380 Other specified arthritis, unspecified site: Secondary | ICD-10-CM

## 2024-02-11 MED ORDER — DULOXETINE HCL 30 MG PO CPEP: 30.0000 mg | ORAL_CAPSULE | Freq: Every day | ORAL | Status: DC

## 2024-02-11 MED ORDER — PROMETHAZINE-DM 6.25-15 MG/5ML PO SYRP: 2.5000 mL | ORAL_SOLUTION | Freq: Four times a day (QID) | ORAL | 0 refills | Status: DC | PRN

## 2024-02-11 NOTE — Patient Instructions (Signed)
 It was a pleasure to see you today.  Thank you for giving [place] the opportunity to be involved in your care.  Below is a brief recap of your visit and next steps.  We will plan to see you again in 6 months.  Summary Resume cymbalta Check cholesterol panel Follow up in 6 months

## 2024-02-11 NOTE — Assessment & Plan Note (Signed)
 A1c 8.2 on labs from January.  Recently seen by endocrinology for follow-up.

## 2024-02-11 NOTE — Progress Notes (Signed)
 Established Patient Office Visit  Subjective   Patient ID: [name], female    DOB: [birth year]  Age: 68 y.o. MRN: [id number]  Chief Complaint  Patient presents with   Care Management    Three month follow up    [name] returns here today for routine follow-up.  She was last evaluated by me in December 2024.  At that time she endorsed poorly controlled chronic musculoskeletal pain and insomnia.  Cymbalta 30 mg daily was added for treatment of both chronic musculoskeletal pain and MDD.  Hydroxyzine was also added for as needed use in the setting of insomnia.  28-month follow-up was arranged.  In the interim, she has been seen by endocrinology for follow-up and established care with rheumatology earlier this week (3/3).  There have otherwise been no acute interval events.  Today [name] reports feeling fairly well.  She endorses chronic musculoskeletal pain and does not have any acute concerns to discuss.  She recently stopped taking Cymbalta as she is concerned that it may be contributing to persistently elevated blood sugar readings.  She also reports that hydroxyzine was too strong and she is no longer taking it.  She additionally endorses chest congestion and states that she recently had a viral upper respiratory infection.  She continues to experience a persistent, nonproductive cough.  Past Medical History:  Diagnosis Date   Arthritis    Diabetes mellitus without complication (HCC)    PONV (postoperative nausea and vomiting)    Pulmonary embolism (HCC) 2005   Past Surgical History:  Procedure Laterality Date   ABDOMINAL HYSTERECTOMY     CARPAL TUNNEL RELEASE Left 2000   CARPAL TUNNEL RELEASE Right 2000   CATARACT EXTRACTION W/PHACO Right 11/21/2014   Procedure: CATARACT EXTRACTION PHACO AND INTRAOCULAR LENS PLACEMENT (IOC);  Surgeon: Gemma Payor, MD;  Location: [facility];  Service: Ophthalmology;  Laterality: Right;  CDE:5.78   TOTAL KNEE ARTHROPLASTY Right 01/13/2017   Procedure:  RIGHT TOTAL KNEE ARTHROPLASTY;  Surgeon: Ollen Gross, MD;  Location: [facility];  Service: Orthopedics;  Laterality: Right;  Adductor Block   Social History   Tobacco Use   Smoking status: Former    Types: Cigarettes    Passive exposure: Current   Smokeless tobacco: Never  Vaping Use   Vaping status: Never Used  Substance Use Topics   Alcohol use: No   Drug use: No   Family History  Problem Relation Age of Onset   Cancer Mother    Liver disease Brother    Diabetes Brother    Diabetes Brother    Allergies  Allergen Reactions   Aspirin Other (See Comments)    "burns my stomach"   Review of Systems  Respiratory:  Positive for cough. Negative for sputum production and shortness of breath.   Musculoskeletal:  Positive for back pain, joint pain, myalgias and neck pain.  All other systems reviewed and are negative.    Objective:     BP 118/75 (BP Location: Left Arm, Patient Position: Sitting, Cuff Size: Normal)   Pulse 83   Ht 5' 6.5" (1.689 m)   Wt 159 lb 6.4 oz (72.3 kg)   SpO2 94%   BMI 25.34 kg/m  BP Readings from Last 3 Encounters:  02/11/24 118/75  02/09/24 111/71  01/01/24 121/71   Physical Exam Vitals reviewed.  Constitutional:      General: She is not in acute distress.    Appearance: Normal appearance. She is not toxic-appearing.  HENT:  Head: Normocephalic and atraumatic.     Right Ear: External ear normal.     Left Ear: External ear normal.     Nose: Nose normal. No congestion or rhinorrhea.     Mouth/Throat:     Mouth: Mucous membranes are moist.     Pharynx: Oropharynx is clear. No oropharyngeal exudate or posterior oropharyngeal erythema.  Eyes:     General: No scleral icterus.    Extraocular Movements: Extraocular movements intact.     Conjunctiva/sclera: Conjunctivae normal.     Pupils: Pupils are equal, round, and reactive to light.  Cardiovascular:     Rate and Rhythm: Normal rate and regular rhythm.     Pulses: Normal pulses.      Heart sounds: Normal heart sounds. No murmur heard.    No friction rub. No gallop.  Pulmonary:     Effort: Pulmonary effort is normal.     Breath sounds: Normal breath sounds. No wheezing, rhonchi or rales.  Abdominal:     General: Abdomen is flat. Bowel sounds are normal. There is no distension.     Palpations: Abdomen is soft.     Tenderness: There is no abdominal tenderness.  Musculoskeletal:        General: No swelling. Normal range of motion.     Cervical back: Normal range of motion.     Right lower leg: No edema.     Left lower leg: No edema.  Lymphadenopathy:     Cervical: No cervical adenopathy.  Skin:    General: Skin is warm and dry.     Capillary Refill: Capillary refill takes less than 2 seconds.     Coloration: Skin is not jaundiced.  Neurological:     General: No focal deficit present.     Mental Status: She is alert and oriented to person, place, and time.  Psychiatric:        Mood and Affect: Mood normal.        Behavior: Behavior normal.   Last CBC Lab Results  Component Value Date   WBC 7.8 01/03/2023   HGB 12.7 01/03/2023   HCT 38.8 01/03/2023   MCV 90.4 01/03/2023   MCH 29.6 01/03/2023   RDW 12.6 01/03/2023   PLT 204 01/03/2023   Last metabolic panel Lab Results  Component Value Date   GLUCOSE 138 (H) 10/03/2023   NA 141 10/03/2023   K 4.1 10/03/2023   CL 104 10/03/2023   CO2 23 10/03/2023   BUN 12 10/03/2023   CREATININE 0.64 10/03/2023   EGFR 97 10/03/2023   CALCIUM 9.0 10/03/2023   PROT 6.0 10/03/2023   ALBUMIN 4.3 10/03/2023   LABGLOB 1.7 10/03/2023   BILITOT 0.3 10/03/2023   ALKPHOS 105 10/03/2023   AST 21 10/03/2023   ALT 20 10/03/2023   ANIONGAP 9 01/03/2023   Last lipids Lab Results  Component Value Date   CHOL 156 10/03/2023   HDL 51 10/03/2023   LDLCALC 90 10/03/2023   TRIG 76 10/03/2023   CHOLHDL 3.1 10/03/2023   Last hemoglobin A1c Lab Results  Component Value Date   HGBA1C 8.2 (A) 01/01/2024   Last thyroid  functions Lab Results  Component Value Date   TSH 0.461 10/03/2023   Last vitamin D Lab Results  Component Value Date   VD25OH 25.7 (L) 10/03/2023     The 10-year ASCVD risk score (Arnett DK, et al., 2019) is: 9.2%    Assessment & Plan:   Problem List Items Addressed This Visit  Type 2 diabetes mellitus with hyperglycemia (HCC)   A1c 8.2 on labs from January.  Recently seen by endocrinology for follow-up.      Hyperlipidemia associated with type 2 diabetes mellitus (HCC) - Primary   Atorvastatin 10 mg daily was started at her last appointment.  Lipid panel last updated in October 2024.  She has not experienced any adverse side effects and starting atorvastatin.  Repeat lipid panel ordered today.      Seronegative arthritis   She has recently established care with rheumatology (Dr. Dimple Casey).  Updated labs and imaging are pending.  She is scheduled for follow-up in early April (4/3).      Chronic pain syndrome   She is currently prescribed hydrocodone-acetaminophen 10-325 mg 3 times daily as needed as well as Lyrica 75 mg 3 times daily.  PDMP reviewed and remains appropriate.  Cymbalta was added at her last appointment, which she reports was effective, however recently she stopped taking it because she is concerned that it is contributing to persistently elevated blood sugar readings.  She does however acknowledge that her readings have not significantly changed since stopping Cymbalta. -Through shared decision making, she will resume Cymbalta 30 mg daily.      Insomnia   Hydroxyzine was most recently prescribed, however she reports that it was too strong.  She recently started taking Flexeril at night, which has been effective.  No additional changes were made today.      Persistent cough   She endorses a persistent, dry cough in the setting of recent viral URI.  Promethazine-DM added today for cough relief      Return in about 6 months (around 08/13/2024).   Billie Lade, MD

## 2024-02-11 NOTE — Assessment & Plan Note (Signed)
 She is currently prescribed hydrocodone-acetaminophen 10-325 mg 3 times daily as needed as well as Lyrica 75 mg 3 times daily.  PDMP reviewed and remains appropriate.  Cymbalta was added at her last appointment, which she reports was effective, however recently she stopped taking it because she is concerned that it is contributing to persistently elevated blood sugar readings.  She does however acknowledge that her readings have not significantly changed since stopping Cymbalta. -Through shared decision making, she will resume Cymbalta 30 mg daily.

## 2024-02-11 NOTE — Assessment & Plan Note (Signed)
 Atorvastatin 10 mg daily was started at her last appointment.  Lipid panel last updated in October 2024.  She has not experienced any adverse side effects and starting atorvastatin.  Repeat lipid panel ordered today.

## 2024-02-11 NOTE — Assessment & Plan Note (Signed)
 She endorses a persistent, dry cough in the setting of recent viral URI.  Promethazine-DM added today for cough relief

## 2024-02-11 NOTE — Assessment & Plan Note (Signed)
 Hydroxyzine was most recently prescribed, however she reports that it was too strong.  She recently started taking Flexeril at night, which has been effective.  No additional changes were made today.

## 2024-02-11 NOTE — Assessment & Plan Note (Signed)
 She has recently established care with rheumatology (Dr. Dimple Casey).  Updated labs and imaging are pending.  She is scheduled for follow-up in early April (4/3).

## 2024-02-12 ENCOUNTER — Encounter: Payer: Self-pay | Admitting: Internal Medicine

## 2024-02-12 LAB — LIPID PANEL
Chol/HDL Ratio: 3.1 ratio (ref 0.0–4.4)
Cholesterol, Total: 106 mg/dL (ref 100–199)
HDL: 34 mg/dL — ABNORMAL LOW (ref >39)
LDL Chol Calc (NIH): 48 mg/dL (ref 0–99)
Triglycerides: 138 mg/dL (ref 0–149)
VLDL Cholesterol Cal: 24 mg/dL (ref 5–40)

## 2024-02-13 LAB — ANA SCREEN,IFA,REFLEX TITER/PATTERN,REFLEX MPLX 11 AB CASCADE
Anti Nuclear Antibody (ANA): NEGATIVE
Cyclic Citrullin Peptide Ab: 20 U — ABNORMAL HIGH
MUTATED CITRULLINATED VIMENTIN (MCV) AB: <20 U/mL (ref <20)
Rheumatoid fact SerPl-aCnc: <10 [IU]/mL (ref <14)

## 2024-02-13 LAB — SEDIMENTATION RATE: Sed Rate: 17 mm/h (ref 0–30)

## 2024-02-13 LAB — C-REACTIVE PROTEIN: CRP: 10.4 mg/L — ABNORMAL HIGH (ref <8.0)

## 2024-02-26 ENCOUNTER — Other Ambulatory Visit: Payer: Self-pay | Admitting: Internal Medicine

## 2024-02-26 DIAGNOSIS — G894 Chronic pain syndrome: Secondary | ICD-10-CM

## 2024-03-10 ENCOUNTER — Ambulatory Visit: Payer: Medicare Other

## 2024-03-10 NOTE — Progress Notes (Signed)
 Office Visit Note  Patient: [name]             Date of Birth: 10/13/1956           MRN: [id number]             PCP: Tobi Fortes, MD Referring: Tobi Fortes, MD Visit Date: 03/11/2024   Subjective:  Follow-up (No changes)  Discussed the use of AI scribe software for clinical note transcription with the patient, who gave verbal consent to proceed.  History of Present Illness   [name] is a 68 year old female with rheumatoid arthritis and osteoarthritis who presents with joint pain and stiffness.  She experiences joint pain and stiffness, primarily affecting her hands, knees, and hips, with a history of rheumatoid arthritis. The symptoms are persistent, with a deep ache and stiffness, especially in the morning. Her elbows are also affected, although elbow pain is less commonly reported. Recent blood tests show a low positive anti-CCP antibody level and a slightly elevated CRP at 10.4. X-rays of her hands reveal no erosive joint damage but indicate wear and tear arthritis, particularly at the tips of her fingers. Her bone density in the hands appears low, although she has not had a DEXA scan.  She reports significant pain and functional limitation in her left knee, diagnosed with osteoarthritis. X-rays reveal bone spurs and narrowing of the joint space, with severe arthritis in the front compartment. She experiences difficulty with knee movement, especially when getting up and down, and describes the pain as 'giving her a fit.' Despite the pain, she remains active, walking frequently and performing daily tasks.  She is concerned about her hip pain, initially thought to be related to her back, as the pain radiates and sometimes prevents her from lying on one side. She remains active in her daily life, often performing physically demanding tasks.  Her family history includes a mother, aged 40, with a history of strong bones, having never broken a bone despite numerous  falls.    Previous HPI 02/09/24 [name] is a 68 year old female with history of rheumatoid arthritis who presents with worsening joint pain and difficulty managing symptoms.   She has a history of rheumatoid arthritis and has been taking hydroxychloroquine (Plaquenil) for years. Recently, she stopped taking it due to a change in doctors and feels worse without it. Her body feels 'sore', and she has difficulty getting started in the morning, with symptoms worsening over time. She experiences significant pain in her shoulders, hips, back, and neck, affecting her sleep, causing her to toss and turn due to discomfort. She also reports pain in her wrists and knees, with a history of a knee fracture two years ago that healed on its own. Her hip pain has been severe for about six months, and she has not had it x-rayed yet.   She has a history of a torn rotator cuff and neck issues, with x-rays done on her neck. She feels a 'crunchy' sensation in her neck and back. She has been diagnosed with a partial rotator cuff tear and has received shoulder injections, which she found helpful. 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 She takes Lyrica, which she finds essential for managing her shoulder pain, but has difficulty getting it prescribed consistently.   She has been told she has fibromyalgia in the past and finds Lyrica helpful for this condition as well.  She experiences muscle weakness and was found to have low vitamin D levels, which improved with supplementation. She takes Voltaren, which helps with pain but causes sleepiness and stomach issues, requiring her to take nausea medication first. She takes it only when desperate, as it provides significant relief despite the side effects.   She has diabetes, which she finds difficult to control. Her diabetes doctor suggested that lack of exercise or pain might be contributing  factors. Despite this, she is physically active, working daily on a farm. She experiences twitching and jerking at night, and her hands and feet sometimes feel 'funny' or don't 'get the message' to function properly. No significant swelling except for a past episode during the COVID shutdown, which was painful.   She has not had a bone density scan but believes her bones are strong due to lifelong milk consumption. She has not been diagnosed with osteoporosis. She reports that her leg was injured by a calf, resulting in a fracture that was painful but healed without significant intervention.    Review of Systems  Constitutional:  Positive for fatigue.  HENT:  Negative for mouth sores and mouth dryness.   Eyes:  Positive for dryness.  Respiratory:  Positive for shortness of breath.   Cardiovascular:  Positive for chest pain. Negative for palpitations.  Gastrointestinal:  Positive for constipation. Negative for blood in stool and diarrhea.  Endocrine: Positive for increased urination.  Genitourinary:  Positive for involuntary urination.  Musculoskeletal:  Positive for joint pain, joint pain, myalgias, muscle weakness, morning stiffness, muscle tenderness and myalgias. Negative for gait problem and joint swelling.  Skin:  Positive for rash and sensitivity to sunlight. Negative for color change and hair loss.  Allergic/Immunologic: Positive for susceptible to infections.  Neurological:  Positive for headaches. Negative for dizziness.  Hematological:  Negative for swollen glands.  Psychiatric/Behavioral:  Positive for sleep disturbance. Negative for depressed mood. The patient is not nervous/anxious.     PMFS History:  Patient Active Problem List   Diagnosis Date Noted   Persistent cough 02/11/2024   Insomnia 11/10/2023   Vitamin D insufficiency 11/10/2023   Need for influenza vaccination 11/10/2023   Type 2 diabetes mellitus with hyperglycemia (HCC) 08/05/2023   Seronegative rheumatoid  arthritis (HCC) 08/05/2023   Allergic rhinitis 08/05/2023   MDD (major depressive disorder) 08/05/2023   Chronic pain syndrome 08/05/2023   Hyperlipidemia associated with type 2 diabetes mellitus (HCC) 08/05/2023   Breast cancer screening by mammogram 08/05/2023   OA (osteoarthritis) of knee 01/13/2017    Past Medical History:  Diagnosis Date   Arthritis    Diabetes mellitus without complication (HCC)    PONV (postoperative nausea and vomiting)    Pulmonary embolism (HCC) 2005    Family History  Problem Relation Age of Onset   Cancer Mother    Liver disease Brother    Diabetes Brother    Diabetes Brother    Past Surgical History:  Procedure Laterality Date   ABDOMINAL HYSTERECTOMY     CARPAL TUNNEL RELEASE Left 2000   CARPAL TUNNEL RELEASE Right 2000   CATARACT EXTRACTION W/PHACO Right 11/21/2014   Procedure: CATARACT EXTRACTION PHACO AND INTRAOCULAR LENS PLACEMENT (IOC);  Surgeon: Anner Kill, MD;  Location: [facility];  Service: Ophthalmology;  Laterality: Right;  CDE:5.78   TOTAL KNEE ARTHROPLASTY Right 01/13/2017   Procedure: RIGHT TOTAL KNEE ARTHROPLASTY;  Surgeon: Liliane Rei, MD;  Location: [facility];  Service: Orthopedics;  Laterality: Right;  Adductor Block   Social History  Social History Narrative   Not on file   Immunization History  Administered Date(s) Administered   Fluad Trivalent(High Dose 65+) 11/10/2023   Rabies, IM 01/11/2023   Tdap 06/28/2015, 01/11/2023     Objective: Vital Signs: BP 108/66 (BP Location: Left Arm, Patient Position: Sitting, Cuff Size: Normal)   Pulse 81   Resp 16   Ht 5\' 6"  (1.676 m)   Wt 154 lb (69.9 kg)   BMI 24.86 kg/m    Physical Exam Eyes:     Conjunctiva/sclera: Conjunctivae normal.  Cardiovascular:     Rate and Rhythm: Normal rate and regular rhythm.  Pulmonary:     Effort: Pulmonary effort is normal.     Breath sounds: Normal breath sounds.  Skin:    General: Skin is warm and dry.  Neurological:     Mental Status:  She is alert.  Psychiatric:        Mood and Affect: Mood normal.      Musculoskeletal Exam:  Shoulders limited overhead abduction in active ROM, tenderness to pressure, no palpable effusion Elbows full ROM no tenderness or swelling Wrists full ROM no tenderness or swelling Fingers heberdon's nodes b/l hands, tenderness to pressure without palpable synovitis, grip strength normal Left knee anterior and joint line tenderness to pressure, patellofemoral crepitus, no palpable effusion   Investigation: No additional findings.  Imaging: No results found.  Recent Labs: Lab Results  Component Value Date   WBC 7.8 01/03/2023   HGB 12.7 01/03/2023   PLT 204 01/03/2023   NA 141 10/03/2023   K 4.1 10/03/2023   CL 104 10/03/2023   CO2 23 10/03/2023   GLUCOSE 138 (H) 10/03/2023   BUN 12 10/03/2023   CREATININE 0.64 10/03/2023   BILITOT 0.3 10/03/2023   ALKPHOS 105 10/03/2023   AST 21 10/03/2023   ALT 20 10/03/2023   PROT 6.0 10/03/2023   ALBUMIN 4.3 10/03/2023   CALCIUM 9.0 10/03/2023   GFRAA >60 01/15/2017    Speciality Comments: No specialty comments available.  Procedures:  Large Joint Inj: L knee on 03/11/2024 11:30 AM Indications: pain Details: 27 G needle, anteromedial approach Medications: 2 mL lidocaine 1 %; 40 mg triamcinolone acetonide 40 MG/ML Outcome: tolerated well, no immediate complications Procedure, treatment alternatives, risks and benefits explained, specific risks discussed. Consent was given by the patient. Immediately prior to procedure a time out was called to verify the correct patient, procedure, equipment, support staff and site/side marked as required. Patient was prepped and draped in the usual sterile fashion.     Allergies: Aspirin and Ibuprofen   Assessment / Plan:     Visit Diagnoses:  Rheumatoid Arthritis Low positive anti-CCP antibodies and elevated CRP indicate mild inflammation. X-rays show no erosive joint damage but low bone density in  hands. Hydroxychloroquine previously stopped without adverse effects. - Restart hydroxychloroquine at 200 mg daily. - Ensure annual eye exams for retinal toxicity monitoring.  Osteoarthritis of the Left Knee Significant osteoarthritis with bone spurs and narrowed joint space causing pain and functional impairment. Candidate for surgery but considering non-surgical options. - Administer injection in the left knee. - Provide printed range of motion exercises. - Consider referral to physical therapy.  Hip Pain Likely muscular or tendon-related, exacerbated by knee dysfunction. X-ray shows no bone deterioration. - Provide printed range of motion exercises.  Low Back Pain Possibly related to hip pain and knee dysfunction. X-ray shows some issues requiring further evaluation. - Consider further imaging if pain persists.  Orders: Orders Placed This Encounter  Procedures   Large Joint Inj: L knee   No orders of the defined types were placed in this encounter.    Follow-Up Instructions: Return in about 3 months (around 06/10/2024) for RA/OA HCQ start/inj f/u 3mos.   Matt Song, MD  Note - This record has been created using [employer].  Chart creation errors have been sought, but may not always  have been located. Such creation errors do not reflect on  the standard of medical care.

## 2024-03-11 ENCOUNTER — Encounter: Payer: Self-pay | Admitting: Internal Medicine

## 2024-03-11 ENCOUNTER — Ambulatory Visit: Attending: Internal Medicine | Admitting: Internal Medicine

## 2024-03-11 VITALS — BP 108/66 | HR 81 | Resp 16 | Ht 66.0 in | Wt 154.0 lb

## 2024-03-11 DIAGNOSIS — M0600 Rheumatoid arthritis without rheumatoid factor, unspecified site: Secondary | ICD-10-CM | POA: Insufficient documentation

## 2024-03-11 DIAGNOSIS — M1712 Unilateral primary osteoarthritis, left knee: Secondary | ICD-10-CM | POA: Diagnosis present

## 2024-03-11 NOTE — Patient Instructions (Addendum)
 Hydroxychloroquine Tablets What is this medication? HYDROXYCHLOROQUINE (hye drox ee KLOR oh kwin) treats autoimmune conditions, such as rheumatoid arthritis and lupus. It works by slowing down an overactive immune system. It may also be used to prevent and treat malaria. It works by killing the parasite that causes malaria. It belongs to a group of medications called DMARDs. This medicine may be used for other purposes; ask your health care provider or pharmacist if you have questions. COMMON BRAND NAME(S): Plaquenil, Quineprox, SOVUNA What should I tell my care team before I take this medication? They need to know if you have any of these conditions: Diabetes Eye disease, vision problems Frequently drink alcohol G6PD deficiency Heart disease Irregular heartbeat or rhythm Kidney disease Liver disease Porphyria Psoriasis An unusual or allergic reaction to hydroxychloroquine, other medications, foods, dyes, or preservatives Pregnant or trying to get pregnant Breastfeeding How should I use this medication? Take this medication by mouth with water. Take it as directed on the prescription label. Do not cut, crush, or chew this medication. Swallow the tablets whole. Take it with food. Do not take it more than directed. Take all of this medication unless your care team tells you to stop it early. Keep taking it even if you think you are better. Take products with antacids in them at a different time of day than this medication. Take this medication 4 hours before or 4 hours after antacids. Talk to your care team if you have questions. Talk to your care team about the use of this medication in children. While this medication may be prescribed for selected conditions, precautions do apply. Overdosage: If you think you have taken too much of this medicine contact a poison control center or emergency room at once. NOTE: This medicine is only for you. Do not share this medicine with others. What if I  miss a dose? If you miss a dose, take it as soon as you can. If it is almost time for your next dose, take only that dose. Do not take double or extra doses. What may interact with this medication? Do not take this medication with any of the following: Cisapride Dronedarone Pimozide Thioridazine This medication may also interact with the following: Ampicillin Antacids Cimetidine Cyclosporine Digoxin Kaolin Medications for diabetes, such as insulin, glipizide, glyburide Medications for seizures, such as carbamazepine, phenobarbital, phenytoin Mefloquine Methotrexate Other medications that cause heart rhythm changes Praziquantel This list may not describe all possible interactions. Give your health care provider a list of all the medicines, herbs, non-prescription drugs, or dietary supplements you use. Also tell them if you smoke, drink alcohol, or use illegal drugs. Some items may interact with your medicine. What should I watch for while using this medication? Visit your care team for regular checks on your progress. Tell your care team if your symptoms do not start to get better or if they get worse. You may need blood work done while you are taking this medication. If you take other medications that can affect heart rhythm, you may need more testing. Talk to your care team if you have questions. Your vision may be tested before and during use of this medication. Tell your care team right away if you have any change in your eyesight. This medication may cause serious skin reactions. They can happen weeks to months after starting the medication. Contact your care team right away if you notice fevers or flu-like symptoms with a rash. The rash may be red or purple and then  turn into blisters or peeling of the skin. Or, you might notice a red rash with swelling of the face, lips or lymph nodes in your neck or under your arms. If you or your family notice any changes in your behavior, such as  new or worsening depression, thoughts of harming yourself, anxiety, or other unusual or disturbing thoughts, or memory loss, call your care team right away. What side effects may I notice from receiving this medication? Side effects that you should report to your care team as soon as possible: Allergic reactions--skin rash, itching, hives, swelling of the face, lips, tongue, or throat Aplastic anemia--unusual weakness or fatigue, dizziness, headache, trouble breathing, increased bleeding or bruising Change in vision Heart rhythm changes--fast or irregular heartbeat, dizziness, feeling faint or lightheaded, chest pain, trouble breathing Infection--fever, chills, cough, or sore throat Low blood sugar (hypoglycemia)--tremors or shaking, anxiety, sweating, cold or clammy skin, confusion, dizziness, rapid heartbeat Muscle injury--unusual weakness or fatigue, muscle pain, dark yellow or brown urine, decrease in amount of urine Pain, tingling, or numbness in the hands or feet Rash, fever, and swollen lymph nodes Redness, blistering, peeling, or loosening of the skin, including inside the mouth Thoughts of suicide or self-harm, worsening mood, or feelings of depression Unusual bruising or bleeding Side effects that usually do not require medical attention (report to your care team if they continue or are bothersome): Diarrhea Headache Nausea Stomach pain Vomiting This list may not describe all possible side effects. Call your doctor for medical advice about side effects. You may report side effects to FDA at 1-800-FDA-1088. Where should I keep my medication? Keep out of the reach of children and pets. Store at room temperature up to 30 degrees C (86 degrees F). Protect from light. Get rid of any unused medication after the expiration date. To get rid of medications that are no longer needed or have expired: Take the medication to a medication take-back program. Check with your pharmacy or law  enforcement to find a location. If you cannot return the medication, check the label or package insert to see if the medication should be thrown out in the garbage or flushed down the toilet. If you are not sure, ask your care team. If it is safe to put it in the trash, empty the medication out of the container. Mix the medication with cat litter, dirt, coffee grounds, or other unwanted substance. Seal the mixture in a bag or container. Put it in the trash. NOTE: This sheet is a summary. It may not cover all possible information. If you have questions about this medicine, talk to your doctor, pharmacist, or health care provider.  2024 Elsevier/Gold Standard (2022-06-03 00:00:00)   Hip Bursitis Rehab Ask your health care provider which exercises are safe for you. Do exercises exactly as told by your health care provider and adjust them as directed. It is normal to feel mild stretching, pulling, tightness, or discomfort as you do these exercises. Stop right away if you feel sudden pain or your pain gets worse. Do not begin these exercises until told by your health care provider. Stretching exercise This exercise warms up your muscles and joints and improves the movement and flexibility of your hip. This exercise also helps to relieve pain and stiffness. Iliotibial band stretch An iliotibial band is a strong band of muscle tissue that runs from the outer side of your hip to the outer side of your thigh and knee. Lie on your side with your left / right  leg in the top position. Bend your left / right knee and grab your ankle. Stretch out your bottom arm to help you balance. Slowly bring your knee back so your thigh is slightly behind your body. Slowly lower your knee toward the floor until you feel a gentle stretch on the outside of your left / right thigh. If you do not feel a stretch and your knee will not lower more toward the floor, place the heel of your other foot on top of your knee and pull your  knee down toward the floor with your foot. Hold this position for __________ seconds. Slowly return to the starting position. Repeat __________ times. Complete this exercise __________ times a day. Strengthening exercises These exercises build strength and endurance in your hip and pelvis. Endurance is the ability to use your muscles for a long time, even after they get tired. Bridge This exercise strengthens the muscles that move your thigh backward (hip extensors). Lie on your back on a firm surface with your knees bent and your feet flat on the floor. Tighten your buttocks muscles and lift your buttocks off the floor until your trunk is level with your thighs. Do not arch your back. You should feel the muscles working in your buttocks and the back of your thighs. If you do not feel these muscles, slide your feet 1-2 inches (2.5-5 cm) farther away from your buttocks. If this exercise is too easy, try doing it with your arms crossed over your chest. Hold this position for __________ seconds. Slowly lower your hips to the starting position. Let your muscles relax completely after each repetition. Repeat __________ times. Complete this exercise __________ times a day. Squats This exercise strengthens the muscles in front of your thigh and knee (quadriceps). Stand in front of a table, with your feet and knees pointing straight ahead. You may rest your hands on the table for balance but not for support. Slowly bend your knees and lower your hips like you are going to sit in a chair. Keep your weight over your heels, not over your toes. Keep your lower legs upright so they are parallel with the table legs. Do not let your hips go lower than your knees. Do not bend lower than told by your health care provider. If your hip pain increases, do not bend as low. Hold the squat position for __________ seconds. Slowly push with your legs to return to standing. Do not use your hands to pull yourself to  standing. Repeat __________ times. Complete this exercise __________ times a day. Hip hike  Stand sideways on a bottom step. Stand on your left / right leg with your other foot unsupported next to the step. You can hold on to the railing or wall for balance if needed. Keep your knees straight and your torso square. Then lift your left / right hip up toward the ceiling. Hold this position for __________ seconds. Slowly let your left / right hip lower toward the floor, past the starting position. Your foot should get closer to the floor. Do not lean or bend your knees. Repeat __________ times. Complete this exercise __________ times a day. Single leg stand This exercise increases your balance. Without shoes, stand near a railing or in a doorway. You may hold on to the railing or door frame as needed for balance. Squeeze your left / right buttock muscles, then lift up your other foot. Do not let your left / right hip push out to the  side. It is helpful to stand in front of a mirror for this exercise so you can watch your hip. Hold this position for __________ seconds. Repeat __________ times. Complete this exercise __________ times a day. traight leg raises, side-lying This exercise strengthens the muscles that rotate the leg at the hip and move it away from your body (hip abductors). Lie on your side with your left / right leg in the top position. Lie so your head, shoulder, knee, and hip line up. Bend your bottom knee slightly to help you balance. Lift your top leg 4-6 inches (10-15 cm) while keeping your toes pointed straight ahead. Hold this position for __________ seconds. Slowly lower your leg to the starting position. Let your muscles relax completely after each repetition. Repeat __________ times. Complete this exercise __________ times a day. Hip abduction, quadruped This is an exercise in which your hands and knees are on the floor, and you lift one knee out to the side. Get on your  hands and knees on a firm, lightly padded surface. Your hands should be directly below your shoulders, and your knees should be directly below your hips. Lift your left / right knee out to the side. Keep your knee bent. Do not twist your body. Hold this position for __________ seconds. Slowly lower your leg back to the starting position. Repeat __________ times. Complete this exercise __________ times a day.

## 2024-03-21 MED ORDER — TRIAMCINOLONE ACETONIDE 40 MG/ML IJ SUSP
40.0000 mg | INTRAMUSCULAR | Status: AC | PRN
  Administered 2024-03-11: 40 mg via INTRA_ARTICULAR

## 2024-03-21 MED ORDER — LIDOCAINE HCL 1 % IJ SOLN
2.0000 mL | INTRAMUSCULAR | Status: AC | PRN
  Administered 2024-03-11: 2 mL

## 2024-03-21 NOTE — Addendum Note (Signed)
 Addended by: Matt Song on: 03/21/2024 01:56 PM   Modules accepted: Level of Service

## 2024-03-26 ENCOUNTER — Other Ambulatory Visit: Payer: Self-pay | Admitting: Internal Medicine

## 2024-03-26 DIAGNOSIS — G894 Chronic pain syndrome: Secondary | ICD-10-CM

## 2024-03-31 ENCOUNTER — Ambulatory Visit (INDEPENDENT_AMBULATORY_CARE_PROVIDER_SITE_OTHER): Payer: Medicare Other | Admitting: Nurse Practitioner

## 2024-03-31 ENCOUNTER — Encounter: Payer: Self-pay | Admitting: Nurse Practitioner

## 2024-03-31 VITALS — BP 92/64 | HR 84 | Ht 66.0 in | Wt 152.4 lb

## 2024-03-31 DIAGNOSIS — Z7984 Long term (current) use of oral hypoglycemic drugs: Secondary | ICD-10-CM

## 2024-03-31 DIAGNOSIS — E1165 Type 2 diabetes mellitus with hyperglycemia: Secondary | ICD-10-CM

## 2024-03-31 DIAGNOSIS — E559 Vitamin D deficiency, unspecified: Secondary | ICD-10-CM

## 2024-03-31 LAB — POCT GLYCOSYLATED HEMOGLOBIN (HGB A1C): HbA1c, POC (controlled diabetic range): 9.1 % — AB (ref 0.0–7.0)

## 2024-03-31 MED ORDER — GLIPIZIDE ER 2.5 MG PO TB24
2.5000 mg | ORAL_TABLET | Freq: Every day | ORAL | 1 refills | Status: DC
Start: 2024-03-31 — End: 2024-08-10

## 2024-03-31 MED ORDER — SITAGLIPTIN PHOSPHATE 100 MG PO TABS
100.0000 mg | ORAL_TABLET | Freq: Every day | ORAL | 1 refills | Status: DC
Start: 2024-03-31 — End: 2024-08-10

## 2024-03-31 MED ORDER — METFORMIN HCL 500 MG PO TABS: 500.0000 mg | ORAL_TABLET | Freq: Two times a day (BID) | ORAL | 3 refills | Status: AC

## 2024-03-31 NOTE — Progress Notes (Signed)
 Endocrinology Follow Up Note       03/31/2024, 3:14 PM   Subjective:    Patient ID: [name], female    DOB: [birth year].  [name] is being seen in follow up after being seen in consultation for management of currently uncontrolled symptomatic diabetes requested by  Tobi Fortes, MD.   Past Medical History:  Diagnosis Date   Arthritis    Diabetes mellitus without complication (HCC)    PONV (postoperative nausea and vomiting)    Pulmonary embolism (HCC) 2005    Past Surgical History:  Procedure Laterality Date   ABDOMINAL HYSTERECTOMY     CARPAL TUNNEL RELEASE Left 2000   CARPAL TUNNEL RELEASE Right 2000   CATARACT EXTRACTION W/PHACO Right 11/21/2014   Procedure: CATARACT EXTRACTION PHACO AND INTRAOCULAR LENS PLACEMENT (IOC);  Surgeon: Anner Kill, MD;  Location: [facility];  Service: Ophthalmology;  Laterality: Right;  CDE:5.78   TOTAL KNEE ARTHROPLASTY Right 01/13/2017   Procedure: RIGHT TOTAL KNEE ARTHROPLASTY;  Surgeon: Liliane Rei, MD;  Location: [facility];  Service: Orthopedics;  Laterality: Right;  Adductor Block    Social History   Socioeconomic History   Marital status: Married    Spouse name: Not on file   Number of children: Not on file   Years of education: Not on file   Highest education level: Not on file  Occupational History   Not on file  Tobacco Use   Smoking status: Former    Types: Cigarettes    Passive exposure: Current   Smokeless tobacco: Never  Vaping Use   Vaping status: Never Used  Substance and Sexual Activity   Alcohol use: No   Drug use: No   Sexual activity: Not on file  Other Topics Concern   Not on file  Social History Narrative   Not on file   Social Drivers of Health   Financial Resource Strain: Not on file  Food Insecurity: Not on file  Transportation Needs: Not on file  Physical Activity: Not on file  Stress: Not on file  Social Connections:  Not on file    Family History  Problem Relation Age of Onset   Cancer Mother    Liver disease Brother    Diabetes Brother    Diabetes Brother     Outpatient Encounter Medications as of 03/31/2024  Medication Sig   glipiZIDE  (GLUCOTROL  XL) 2.5 MG 24 hr tablet Take 1 tablet (2.5 mg total) by mouth daily with breakfast.   albuterol  (PROVENTIL  HFA;VENTOLIN  HFA) 108 (90 Base) MCG/ACT inhaler Inhale 2 puffs into the lungs every 6 (six) hours as needed for wheezing or shortness of breath. (Patient not taking: Reported on 03/11/2024)   Ascorbic Acid (VITAMIN C PO) Take by mouth.   atorvastatin  (LIPITOR) 10 MG tablet Take 1 tablet (10 mg total) by mouth daily.   cetirizine (ZYRTEC) 10 MG tablet Take 10 mg by mouth daily.   cyclobenzaprine  (FLEXERIL ) 10 MG tablet Take 1 tablet (10 mg total) by mouth at bedtime as needed.   diclofenac  (VOLTAREN ) 75 MG EC tablet Take 75 mg by mouth 2 (two) times daily.   diclofenac  Sodium (VOLTAREN  ARTHRITIS PAIN) 1 % GEL Apply  2 g topically 4 (four) times daily.   DULoxetine  (CYMBALTA ) 30 MG capsule Take 1 capsule (30 mg total) by mouth daily. (Patient not taking: Reported on 03/11/2024)   erythromycin ophthalmic ointment SMARTSIG:In Eye(s)   fluticasone  (FLONASE ) 50 MCG/ACT nasal spray Place 1 spray into both nostrils daily as needed for allergies.    glucose blood (CONTOUR NEXT TEST) test strip Use as instructed to monitor glucose once daily   HYDROcodone -acetaminophen  (NORCO) 10-325 MG tablet Take 1 tablet by mouth every 8 (eight) hours as needed for severe pain (pain score 7-10).   metFORMIN  (GLUCOPHAGE ) 500 MG tablet Take 1 tablet (500 mg total) by mouth 2 (two) times daily with a meal.   omeprazole (PRILOSEC) 40 MG capsule Take 40 mg by mouth every morning.   ondansetron  (ZOFRAN ) 4 MG tablet Take 4 mg by mouth every 8 (eight) hours as needed.   pregabalin  (LYRICA ) 75 MG capsule TAKE ONE CAPSULE BY MOUTH 3 TIMES A DAY FOR NERVES   promethazine -dextromethorphan  (PROMETHAZINE -DM) 6.25-15 MG/5ML syrup Take 2.5 mLs by mouth 4 (four) times daily as needed for cough. (Patient not taking: Reported on 03/11/2024)   sitaGLIPtin  (JANUVIA ) 100 MG tablet Take 1 tablet (100 mg total) by mouth daily.   Vitamin D , Ergocalciferol , (DRISDOL ) 1.25 MG (50000 UNIT) CAPS capsule Take 1 capsule (50,000 Units total) by mouth every 7 (seven) days.   [DISCONTINUED] metFORMIN  (GLUCOPHAGE ) 500 MG tablet Take 1 tablet (500 mg total) by mouth 2 (two) times daily with a meal.   [DISCONTINUED] sitaGLIPtin  (JANUVIA ) 100 MG tablet Take 1 tablet (100 mg total) by mouth daily.   No facility-administered encounter medications on file as of 03/31/2024.    ALLERGIES: Allergies  Allergen Reactions   Aspirin Other (See Comments)    "burns my stomach"   Ibuprofen     VACCINATION STATUS: Immunization History  Administered Date(s) Administered   Fluad Trivalent(High Dose 65+) 11/10/2023   Rabies, IM 01/11/2023   Tdap 06/28/2015, 01/11/2023    Diabetes She presents for her follow-up diabetic visit. She has type 2 diabetes mellitus. Onset time: diagnosed at approx age of 10. Her disease course has been worsening. There are no hypoglycemic associated symptoms. Associated symptoms include fatigue. Pertinent negatives for diabetes include no polydipsia and no weight loss. There are no hypoglycemic complications. Symptoms are stable. Diabetic complications include heart disease. Risk factors for coronary artery disease include diabetes mellitus, dyslipidemia and family history. Current diabetic treatment includes oral agent (monotherapy). She is compliant with treatment most of the time. Her weight is decreasing steadily. She is following a generally healthy diet. When asked about meal planning, she reported none. She has not had a previous visit with a dietitian. She participates in exercise intermittently. Her home blood glucose trend is increasing steadily. Her breakfast blood glucose range is  generally >200 mg/dl. Her overall blood glucose range is 180-200 mg/dl. (She presents today with her meter, no logs, showing gross fasting hyperglycemia overall.  Her POCT A1c today is 9.1%, increasing from last visit of 8.2%.  She has readings ranging from 114-293.  She recently had steroid injection and was sick in February needing antibiotics and steroids as well.  She stays in pain constantly.  She says she needs knee surgery soon.) She does not see a podiatrist.Eye exam is current.    Review of systems  Constitutional: +decreasing body weight,  current Body mass index is 24.6 kg/m. , no fatigue, no subjective hyperthermia, no subjective hypothermia Eyes: no blurry vision, no  xerophthalmia ENT: no sore throat, no nodules palpated in throat, no dysphagia/odynophagia, no hoarseness Cardiovascular: no chest pain, no shortness of breath, no palpitations, no leg swelling Respiratory: no cough, no shortness of breath Gastrointestinal: no nausea/vomiting/diarrhea Musculoskeletal: + diffuse muscle/joint aches (seeing Rheumatology)- recently had steroid injection Skin: no rashes, no hyperemia Neurological: no tremors, no numbness, no tingling, no dizziness Psychiatric: no depression, no anxiety  Objective:     BP 92/64   Pulse 84   Ht 5\' 6"  (1.676 m)   Wt 152 lb 6.4 oz (69.1 kg)   BMI 24.60 kg/m   Wt Readings from Last 3 Encounters:  03/31/24 152 lb 6.4 oz (69.1 kg)  03/11/24 154 lb (69.9 kg)  02/11/24 159 lb 6.4 oz (72.3 kg)     BP Readings from Last 3 Encounters:  03/31/24 92/64  03/11/24 108/66  02/11/24 118/75      Physical Exam- Limited  Constitutional:  Body mass index is 24.6 kg/m. , not in acute distress, normal state of mind Eyes:  EOMI, no exophthalmos Musculoskeletal: no gross deformities, strength intact in all four extremities, no gross restriction of joint movements Skin:  no rashes, no hyperemia Neurological: no tremor with outstretched hands   Diabetic Foot  Exam - Simple   No data filed     CMP ( most recent) CMP     Component Value Date/Time   NA 141 10/03/2023 1359   K 4.1 10/03/2023 1359   CL 104 10/03/2023 1359   CO2 23 10/03/2023 1359   GLUCOSE 138 (H) 10/03/2023 1359   GLUCOSE 147 (H) 01/03/2023 1405   BUN 12 10/03/2023 1359   CREATININE 0.64 10/03/2023 1359   CALCIUM  9.0 10/03/2023 1359   PROT 6.0 10/03/2023 1359   ALBUMIN 4.3 10/03/2023 1359   AST 21 10/03/2023 1359   ALT 20 10/03/2023 1359   ALKPHOS 105 10/03/2023 1359   BILITOT 0.3 10/03/2023 1359   EGFR 97 10/03/2023 1359   GFRNONAA >60 01/03/2023 1405     Diabetic Labs (most recent): Lab Results  Component Value Date   HGBA1C 9.1 (A) 03/31/2024   HGBA1C 8.2 (A) 01/01/2024   HGBA1C 7.9 (A) 09/29/2023   MICROALBUR 30 mg/L 09/29/2023     Lipid Panel ( most recent) Lipid Panel     Component Value Date/Time   CHOL 106 02/11/2024 1602   TRIG 138 02/11/2024 1602   HDL 34 (L) 02/11/2024 1602   CHOLHDL 3.1 02/11/2024 1602   LDLCALC 48 02/11/2024 1602   LABVLDL 24 02/11/2024 1602      Lab Results  Component Value Date   TSH 0.461 10/03/2023   TSH 0.26 (A) 02/18/2023   FREET4 1.32 10/03/2023           Assessment & Plan:   1) Type 2 diabetes mellitus with hyperglycemia, without long-term current use of insulin  (HCC)  She presents today with her meter, no logs, showing gross fasting hyperglycemia overall.  Her POCT A1c today is 9.1%, increasing from last visit of 8.2%.  She has readings ranging from 114-293.  She recently had steroid injection and was sick in February needing antibiotics and steroids as well.  She stays in pain constantly.  She says she needs knee surgery soon.  - [name] has currently uncontrolled symptomatic type 2 DM since 68 years of age.   -Recent labs reviewed.  - I had a long discussion with her about the progressive nature of diabetes and the pathology behind its complications. -her diabetes  is complicated by CAD and  chronic intermittent use of steroids and she remains at a high risk for more acute and chronic complications which include CAD, CVA, CKD, retinopathy, and neuropathy. These are all discussed in detail with her.  The following Lifestyle Medicine recommendations according to American College of Lifestyle Medicine [facility]) were discussed and offered to patient and she agrees to start the journey:  A. Whole Foods, Plant-based plate comprising of fruits and vegetables, plant-based proteins, whole-grain carbohydrates was discussed in detail with the patient.   A list for source of those nutrients were also provided to the patient.  Patient will use only water  or unsweetened tea for hydration. B.  The need to stay away from risky substances including alcohol, smoking; obtaining 7 to 9 hours of restorative sleep, at least 150 minutes of moderate intensity exercise weekly, the importance of healthy social connections,  and stress reduction techniques were discussed. C.  A full color page of  Calorie density of various food groups per pound showing examples of each food groups was provided to the patient.  - Nutritional counseling repeated at each appointment due to patients tendency to fall back in to old habits.  - The patient admits there is a room for improvement in their diet and drink choices. -  Suggestion is made for the patient to avoid simple carbohydrates from their diet including Cakes, Sweet Desserts / Pastries, Ice Cream, Soda (diet and regular), Sweet Tea, Candies, Chips, Cookies, Sweet Pastries, Store Bought Juices, Alcohol in Excess of 1-2 drinks a day, Artificial Sweeteners, Coffee Creamer, and "Sugar-free" Products. This will help patient to have stable blood glucose profile and potentially avoid unintended weight gain.   - I encouraged the patient to switch to unprocessed or minimally processed complex starch and increased protein intake (animal or plant source), fruits, and vegetables.   -  Patient is advised to stick to a routine mealtimes to eat 3 meals a day and avoid unnecessary snacks (to snack only to correct hypoglycemia).  - I have approached her with the following individualized plan to manage her diabetes and patient agrees:   -She is advised to continue Metformin  500 mg po twice daily after meals (cannot tolerate higher doses due to GI SE) and Januvia  100 mg po daily.  I also added very low dose Glipizide  2.5 mg XL daily with breakfast.  She has been on similar med in the past and noted significant drop in glucose.  I am hoping that this is low enough to where she wont have dangerous hypoglycemia.  -she is encouraged to continue monitoring glucose once daily, before breakfast, and to call the clinic if she has readings less than 70 or above 300 for 3 tests in a row.  - Adjustment parameters are given to her for hypo and hyperglycemia in writing.  - her Ozempic was previously discontinued, risk outweighs benefit for this patient.  she is not an ideal candidate for incretin therapy given body habitus and BMI <25.   - Specific targets for  A1c; LDL, HDL, and Triglycerides were discussed with the patient.  2) Blood Pressure /Hypertension:  her blood pressure is controlled to target without the use of antihypertensive medications.    3) Lipids/Hyperlipidemia:    Review of her recent lipid panel from 02/11/24 showed controlled LDL at 48 .  She is advised to continue Lipitor 10 mg po daily.    4)  Weight/Diet:  her Body mass index is 24.6 kg/m.  -  she is NOT a candidate for weight loss. I discussed with her the fact that loss of 5 - 10% of her  current body weight will have the most impact on her diabetes management.  Exercise, and detailed carbohydrates information provided  -  detailed on discharge instructions.  5) Chronic Care/Health Maintenance: -she is not on ACEI/ARB or Statin medications and is encouraged to initiate and continue to follow up with Ophthalmology,  Dentist, Podiatrist at least yearly or according to recommendations, and advised to stay away from smoking. I have recommended yearly flu vaccine and pneumonia vaccine at least every 5 years; moderate intensity exercise for up to 150 minutes weekly; and sleep for at least 7 hours a day.  6) Abnormal TSH-resolved Her repeat thyroid  labs were normal.  Thyroid  antibodies were negative, ruling out autoimmune thyroid  dysfunction.  7)Fatigue Her recent vitamin D  level was 25.7 on 10/03/23.  She was started on Ergocalciferol .  She notes this has helped with her energy level some.   - she is advised to maintain close follow up with Kermit Ped Heath Litten, MD for primary care needs, as well as her other providers for optimal and coordinated care.     I spent  30  minutes in the care of the patient today including review of labs from CMP, Lipids, Thyroid  Function, Hematology (current and previous including abstractions from other facilities); face-to-face time discussing  her blood glucose readings/logs, discussing hypoglycemia and hyperglycemia episodes and symptoms, medications doses, her options of short and long term treatment based on the latest standards of care / guidelines;  discussion about incorporating lifestyle medicine;  and documenting the encounter. Risk reduction counseling performed per USPSTF guidelines to reduce obesity and cardiovascular risk factors.     Please refer to Patient Instructions for Blood Glucose Monitoring and Insulin /Medications Dosing Guide"  in media tab for additional information. Please  also refer to " Patient Self Inventory" in the Media  tab for reviewed elements of pertinent patient history.  [name] participated in the discussions, expressed understanding, and voiced agreement with the above plans.  All questions were answered to her satisfaction. she is encouraged to contact clinic should she have any questions or concerns prior to her return  visit.     Follow up plan: - Return in about 4 months (around 07/31/2024) for Diabetes F/U with A1c in office, No previsit labs, Bring meter and logs.   Hulon Magic, [facility] [address] [place], [place] [address] Phone: [phone] Fax: [phone]  03/31/2024, 3:14 PM

## 2024-04-26 ENCOUNTER — Other Ambulatory Visit: Payer: Self-pay | Admitting: Internal Medicine

## 2024-04-26 DIAGNOSIS — G894 Chronic pain syndrome: Secondary | ICD-10-CM

## 2024-05-27 ENCOUNTER — Other Ambulatory Visit: Payer: Self-pay | Admitting: Internal Medicine

## 2024-05-27 DIAGNOSIS — G894 Chronic pain syndrome: Secondary | ICD-10-CM

## 2024-05-28 ENCOUNTER — Other Ambulatory Visit: Payer: Self-pay | Admitting: *Deleted

## 2024-05-28 DIAGNOSIS — Z79899 Other long term (current) drug therapy: Secondary | ICD-10-CM

## 2024-05-28 MED ORDER — HYDROXYCHLOROQUINE SULFATE 200 MG PO TABS: 200.0000 mg | ORAL_TABLET | Freq: Every day | ORAL | 0 refills | Status: DC

## 2024-05-28 NOTE — Telephone Encounter (Signed)
 Patient contacted the office requesting a refill on PLQ.   Eye exam: not on file. Patient has an appointment on 06/09/2024.    Labs: not on file. Patient to update on Monday. Lab orders released.   Next Visit: 06/10/2024  Last Visit: 03/11/2024  DX: Rheumatoid Arthritis   Current Dose per office note 03/11/2024: hydroxychloroquine at 200 mg daily.   Okay to refill Plaquenil?

## 2024-05-28 NOTE — Progress Notes (Signed)
 Office Visit Note  Patient: [name]             Date of Birth: 05/21/1956           MRN: [id number]             PCP: No primary care provider on file. Referring: Melvenia Manus BRAVO, MD Visit Date: 06/10/2024   Subjective:  Follow-up (Patient has been taking the Plaquenil  2 a day, since that's what she previously did, just noticed that the prescription says take 1 a day. )   History of Present Illness:  Discussed the use of AI scribe software for clinical note transcription with the patient, who gave verbal consent to proceed.  History of Present Illness   [name] is a 68 y.o. female here for follow up with rheumatoid arthritis and osteoarthritis on HCQ 400 mg daily. She presents with joint pain and stiffness.    She experiences persistent joint pain, particularly in her knees, ankles, toes, elbows, and back. The pain is described as 'never ending' and significantly affects her ability to sleep, requiring her to be 'dead tired' to fall asleep. Restless leg syndrome exacerbates her discomfort, especially when her ankles are affected, and her right toes were particularly painful last night. The pain sometimes radiates to her arm and elbow.  Her current medication regimen includes Flexeril  as needed for pain relief, but she is cautious about its use due to its lasting effects the next day, which interfere with her ability to operate machinery. Although Cymbalta  helps, she has not been taking it due to concerns about its side effects. She uses diclofenac  regularly, which she finds effective, but notes it also has lasting effects into the next day. She has been taking hydroxychloroquine , which was prescribed at 1 tablet daily but has been taking it 2 tablets daily.  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 She notes that her knee swells and is 'heating up with arthritis,' and she uses a cushion to avoid  putting pressure on it. She reports previous knee injection in April provided significant relief, making her feel better overall.  She denies any recent illnesses but feels 'dragging' and tired. She has not been sleeping well for the past two years, although she used to sleep fine before.   Previous HPI 03/11/2024 LETTI [name] is a 68 year old female with rheumatoid arthritis and osteoarthritis who presents with joint pain and stiffness.   She experiences joint pain and stiffness, primarily affecting her hands, knees, and hips, with a history of rheumatoid arthritis. The symptoms are persistent, with a deep ache and stiffness, especially in the morning. Her elbows are also affected, although elbow pain is less commonly reported. Recent blood tests show a low positive anti-CCP antibody level and a slightly elevated CRP at 10.4. X-rays of her hands reveal no erosive joint damage but indicate wear and tear arthritis, particularly at the tips of her fingers. Her bone density in the hands appears low, although she has not had a DEXA scan.   She reports significant pain and functional limitation in her left knee, diagnosed with osteoarthritis. X-rays reveal bone spurs and narrowing of the joint space, with severe arthritis in the front compartment. She experiences difficulty with knee movement, especially when getting up and down, and describes the pain as 'giving her a fit.' Despite the pain, she remains active, walking frequently and performing  daily tasks.   She is concerned about her hip pain, initially thought to be related to her back, as the pain radiates and sometimes prevents her from lying on one side. She remains active in her daily life, often performing physically demanding tasks.   Her family history includes a mother, aged 23, with a history of strong bones, having never broken a bone despite numerous falls.      Previous HPI 02/09/24 [name] is a 68 year old female with history  of rheumatoid arthritis who presents with worsening joint pain and difficulty managing symptoms.   She has a history of rheumatoid arthritis and has been taking hydroxychloroquine  (Plaquenil ) for years. Recently, she stopped taking it due to a change in doctors and feels worse without it. Her body feels 'sore', and she has difficulty getting started in the morning, with symptoms worsening over time. She experiences significant pain in her shoulders, hips, back, and neck, affecting her sleep, causing her to toss and turn due to discomfort. She also reports pain in her wrists and knees, with a history of a knee fracture two years ago that healed on its own. Her hip pain has been severe for about six months, and she has not had it x-rayed yet.   She has a history of a torn rotator cuff and neck issues, with x-rays done on her neck. She feels a 'crunchy' sensation in her neck and back. She has been diagnosed with a partial rotator cuff tear and has received shoulder injections, which she found helpful. 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 She takes Lyrica , which she finds essential for managing her shoulder pain, but has difficulty getting it prescribed consistently.   She has been told she has fibromyalgia in the past and finds Lyrica  helpful for this condition as well. She experiences muscle weakness and was found to have low vitamin D  levels, which improved with supplementation. She takes Voltaren , which helps with pain but causes sleepiness and stomach issues, requiring her to take nausea medication first. She takes it only when desperate, as it provides significant relief despite the side effects.   She has diabetes, which she finds difficult to control. Her diabetes doctor suggested that lack of exercise or pain might be contributing factors. Despite this, she is physically active, working daily on a farm. She experiences  twitching and jerking at night, and her hands and feet sometimes feel 'funny' or don't 'get the message' to function properly. No significant swelling except for a past episode during the COVID shutdown, which was painful.   She has not had a bone density scan but believes her bones are strong due to lifelong milk consumption. She has not been diagnosed with osteoporosis. She reports that her leg was injured by a calf, resulting in a fracture that was painful but healed without significant intervention.    Review of Systems  Constitutional:  Positive for fatigue.  HENT:  Negative for mouth sores and mouth dryness.   Eyes:  Positive for dryness.  Respiratory:  Negative for shortness of breath.   Cardiovascular:  Negative for chest pain and palpitations.  Gastrointestinal:  Negative for blood in stool, constipation and diarrhea.  Endocrine: Positive for increased urination.  Genitourinary:  Negative for involuntary urination.  Musculoskeletal:  Positive for joint pain, joint pain, myalgias, muscle weakness, morning stiffness, muscle tenderness and myalgias. Negative for gait problem and joint swelling.  Skin:  Positive for  sensitivity to sunlight. Negative for color change, rash and hair loss.  Allergic/Immunologic: Positive for susceptible to infections.  Neurological:  Negative for dizziness and headaches.  Hematological:  Negative for swollen glands.  Psychiatric/Behavioral:  Positive for sleep disturbance. Negative for depressed mood. The patient is not nervous/anxious.     PMFS History:  Patient Active Problem List   Diagnosis Date Noted   Persistent cough 02/11/2024   Insomnia 11/10/2023   Vitamin D  insufficiency 11/10/2023   Need for influenza vaccination 11/10/2023   Type 2 diabetes mellitus with hyperglycemia (HCC) 08/05/2023   Seronegative rheumatoid arthritis (HCC) 08/05/2023   Allergic rhinitis 08/05/2023   MDD (major depressive disorder) 08/05/2023   Chronic pain syndrome  08/05/2023   Hyperlipidemia associated with type 2 diabetes mellitus (HCC) 08/05/2023   Breast cancer screening by mammogram 08/05/2023   OA (osteoarthritis) of knee 01/13/2017    Past Medical History:  Diagnosis Date   Arthritis    Diabetes mellitus without complication (HCC)    PONV (postoperative nausea and vomiting)    Pulmonary embolism (HCC) 2005    Family History  Problem Relation Age of Onset   Cancer Mother    Liver disease Brother    Diabetes Brother    Diabetes Brother    Past Surgical History:  Procedure Laterality Date   ABDOMINAL HYSTERECTOMY     CARPAL TUNNEL RELEASE Left 2000   CARPAL TUNNEL RELEASE Right 2000   CATARACT EXTRACTION W/PHACO Right 11/21/2014   Procedure: CATARACT EXTRACTION PHACO AND INTRAOCULAR LENS PLACEMENT (IOC);  Surgeon: Cherene Mania, MD;  Location: [facility];  Service: Ophthalmology;  Laterality: Right;  CDE:5.78   TOTAL KNEE ARTHROPLASTY Right 01/13/2017   Procedure: RIGHT TOTAL KNEE ARTHROPLASTY;  Surgeon: Dempsey Moan, MD;  Location: [facility];  Service: Orthopedics;  Laterality: Right;  Adductor Block   Social History   Social History Narrative   Not on file   Immunization History  Administered Date(s) Administered   Fluad Trivalent(High Dose 65+) 11/10/2023   Rabies, IM 01/11/2023   Tdap 06/28/2015, 01/11/2023     Objective: Vital Signs: BP (!) 90/52 (BP Location: Left Arm, Patient Position: Sitting, Cuff Size: Normal)   Pulse 65   Resp 16   Ht 5' 8.5 (1.74 m)   Wt 154 lb (69.9 kg)   BMI 23.08 kg/m    Physical Exam Eyes:     Conjunctiva/sclera: Conjunctivae normal.  Cardiovascular:     Rate and Rhythm: Normal rate and regular rhythm.  Pulmonary:     Effort: Pulmonary effort is normal.     Breath sounds: Normal breath sounds.  Skin:    General: Skin is warm and dry.  Neurological:     Mental Status: She is alert.  Psychiatric:        Mood and Affect: Mood normal.      Musculoskeletal Exam:  Shoulders limited  overhead abduction in active ROM, tenderness to pressure, no palpable effusion Elbows full ROM no tenderness or swelling Wrists full ROM no tenderness or swelling Fingers heberdon's nodes b/l hands, tenderness to pressure without palpable synovitis, grip strength normal Left knee anterior and joint line tenderness to pressure, patellofemoral crepitus, no palpable effusion   Investigation: No additional findings.  Imaging: No results found.  Recent Labs: Lab Results  Component Value Date   WBC 6.5 06/01/2024   HGB 12.2 06/01/2024   PLT 198 06/01/2024   NA 141 06/01/2024   K 4.3 06/01/2024   CL 105 06/01/2024   CO2 22 06/01/2024  GLUCOSE 183 (H) 06/01/2024   BUN 14 06/01/2024   CREATININE 0.65 06/01/2024   BILITOT 0.2 06/01/2024   ALKPHOS 97 06/01/2024   AST 20 06/01/2024   ALT 17 06/01/2024   PROT 6.1 06/01/2024   ALBUMIN 4.2 06/01/2024   CALCIUM  9.5 06/01/2024   GFRAA >60 01/15/2017    Speciality Comments: [facility]  Dr Inocente will get a plaquenil  exam in January.  Procedures:  No procedures performed Allergies: Aspirin and Ibuprofen   Assessment / Plan:     Visit Diagnoses: Seronegative rheumatoid arthritis (HCC) - Plan: hydroxychloroquine  (PLAQUENIL ) 200 MG tablet, cyclobenzaprine  (FLEXERIL ) 5 MG tablet Chronic osteoarthritis with significant knee involvement. No peripheral synovitis appreciable on exam, partial improvement with HCQ. Previous knee injection provided relief, but symptoms have returned within a few months and not very keen on repeat procedure. She is now above recommended max safe dosing of 5 mg/kg due to weight loss. - Adjust hydroxychloroquine  dose to 300 mg daily - Can continue diclofenac  use PRN - Recommend use of a kneeler or knee pad to reduce pressure on the knee.  High risk medication use - Restart hydroxychloroquine  at 200 mg daily. Needs a PLQ eye exam Adjusting dose as above. She had recent labs obtained which we reviewed,  CBC and CMP normal no problem with current medication. No serious interval infections.  Primary osteoarthritis of left knee - S/P Large Joint Inj: L knee on 03/11/2024  Pain in left hip Chronic pain Chronic pain affecting multiple areas, impacting sleep with restless legs and discomfort. Current medications include Flexeril  and diclofenac , with concerns about side effects affecting alertness. Cymbalta  previously helped but caused dulling effects. - Recommend cutting Flexeril  dose in half to 5 mg trial to reduce next-day drowsiness and allow for more regular use.   Orders: No orders of the defined types were placed in this encounter.  Meds ordered this encounter  Medications   hydroxychloroquine  (PLAQUENIL ) 200 MG tablet    Sig: Take 1.5 tablets (300 mg total) by mouth daily.    Dispense:  135 tablet    Refill:  0   cyclobenzaprine  (FLEXERIL ) 5 MG tablet    Sig: Take 1 tablet (5 mg total) by mouth at bedtime as needed.    Dispense:  30 tablet    Refill:  2     Follow-Up Instructions: Return in about 3 months (around 09/10/2024) for RA/OA on HCQ f/u 3mos.   Lonni LELON Ester, MD  Note - This record has been created using [employer].  Chart creation errors have been sought, but may not always  have been located. Such creation errors do not reflect on  the standard of medical care.

## 2024-06-02 ENCOUNTER — Ambulatory Visit: Payer: Self-pay | Admitting: Internal Medicine

## 2024-06-02 LAB — CBC WITH DIFFERENTIAL/PLATELET
Basophils Absolute: 0.1 10*3/uL (ref 0.0–0.2)
Basos: 1 %
EOS (ABSOLUTE): 0.2 10*3/uL (ref 0.0–0.4)
Eos: 3 %
Hematocrit: 39.1 % (ref 34.0–46.6)
Hemoglobin: 12.2 g/dL (ref 11.1–15.9)
Immature Grans (Abs): 0 10*3/uL (ref 0.0–0.1)
Immature Granulocytes: 0 %
Lymphocytes Absolute: 1.8 10*3/uL (ref 0.7–3.1)
Lymphs: 28 %
MCH: 28.6 pg (ref 26.6–33.0)
MCHC: 31.2 g/dL — ABNORMAL LOW (ref 31.5–35.7)
MCV: 92 fL (ref 79–97)
Monocytes Absolute: 0.5 10*3/uL (ref 0.1–0.9)
Monocytes: 7 %
Neutrophils Absolute: 4 10*3/uL (ref 1.4–7.0)
Neutrophils: 61 %
Platelets: 198 10*3/uL (ref 150–450)
RBC: 4.26 x10E6/uL (ref 3.77–5.28)
RDW: 12.4 % (ref 11.7–15.4)
WBC: 6.5 10*3/uL (ref 3.4–10.8)

## 2024-06-02 LAB — COMPREHENSIVE METABOLIC PANEL WITH GFR
ALT: 17 IU/L (ref 0–32)
AST: 20 IU/L (ref 0–40)
Albumin: 4.2 g/dL (ref 3.9–4.9)
Alkaline Phosphatase: 97 IU/L (ref 44–121)
BUN/Creatinine Ratio: 22 (ref 12–28)
BUN: 14 mg/dL (ref 8–27)
Bilirubin Total: 0.2 mg/dL (ref 0.0–1.2)
CO2: 22 mmol/L (ref 20–29)
Calcium: 9.5 mg/dL (ref 8.7–10.3)
Chloride: 105 mmol/L (ref 96–106)
Creatinine, Ser: 0.65 mg/dL (ref 0.57–1.00)
Globulin, Total: 1.9 g/dL (ref 1.5–4.5)
Glucose: 183 mg/dL — ABNORMAL HIGH (ref 70–99)
Potassium: 4.3 mmol/L (ref 3.5–5.2)
Sodium: 141 mmol/L (ref 134–144)
Total Protein: 6.1 g/dL (ref 6.0–8.5)
eGFR: 96 mL/min/{1.73_m2} (ref >59)

## 2024-06-02 NOTE — Progress Notes (Signed)
 Blood count and kidney and liver function tests are normal.  Her blood sugar was 183 but this was a random so is not high enough to be a concern.

## 2024-06-09 LAB — HM DIABETES EYE EXAM

## 2024-06-10 ENCOUNTER — Ambulatory Visit: Attending: Internal Medicine | Admitting: Internal Medicine

## 2024-06-10 ENCOUNTER — Encounter: Payer: Self-pay | Admitting: Internal Medicine

## 2024-06-10 VITALS — BP 90/52 | HR 65 | Resp 16 | Ht 68.5 in | Wt 154.0 lb

## 2024-06-10 DIAGNOSIS — M1712 Unilateral primary osteoarthritis, left knee: Secondary | ICD-10-CM | POA: Insufficient documentation

## 2024-06-10 DIAGNOSIS — M25552 Pain in left hip: Secondary | ICD-10-CM | POA: Insufficient documentation

## 2024-06-10 DIAGNOSIS — Z79899 Other long term (current) drug therapy: Secondary | ICD-10-CM | POA: Diagnosis present

## 2024-06-10 DIAGNOSIS — M0600 Rheumatoid arthritis without rheumatoid factor, unspecified site: Secondary | ICD-10-CM | POA: Diagnosis not present

## 2024-06-10 MED ORDER — CYCLOBENZAPRINE HCL 5 MG PO TABS: 5.0000 mg | ORAL_TABLET | Freq: Every evening | ORAL | 2 refills | Status: AC | PRN

## 2024-06-10 MED ORDER — HYDROXYCHLOROQUINE SULFATE 200 MG PO TABS: 300.0000 mg | ORAL_TABLET | Freq: Every day | ORAL | 0 refills | Status: DC

## 2024-06-23 ENCOUNTER — Ambulatory Visit: Payer: Self-pay

## 2024-06-23 ENCOUNTER — Telehealth: Payer: Self-pay | Admitting: Nurse Practitioner

## 2024-06-23 DIAGNOSIS — G894 Chronic pain syndrome: Secondary | ICD-10-CM

## 2024-06-23 NOTE — Telephone Encounter (Signed)
 See other note. Response sent to provider

## 2024-06-23 NOTE — Telephone Encounter (Signed)
 FYI Only or Action Required?: Action required by provider: medication refill request.  Patient was last seen in primary care on 02/11/2024 by Melvenia Manus BRAVO, MD.  Called Nurse Triage reporting Heartburn.  Symptoms began several days ago.  Interventions attempted: Nothing.  Symptoms are: unchanged.  Triage Disposition: Home Care  Patient/caregiver understands and will follow disposition?: Yes    Copied from CRM [id number]. Topic: Clinical - Prescription Issue >> Jun 23, 2024  1:10 PM [name] wrote: Reason for CRM: Patient is having heart burn and has been without her medication for 3 weeks. omeprazole (PRILOSEC) 40 MG capsule [[id number]]    pregabalin  (LYRICA ) 75 MG capsule [[id number]] and HYDROcodone -acetaminophen  (NORCO) 10-325 MG tablet [[id number]] is due for a refill 7/19.    Reason for Disposition  [1] Abdominal pain is intermittent AND [2] shoots into chest, with sour taste in mouth AND [3] symptoms same as previously diagnosed reflux and not tried antacids  Answer Assessment - Initial Assessment Questions Patient would like to refill her medication prior to her upcoming appointment. She is out of her omeprazole (PRILOSEC) 40 MG capsule , and states she has been calling in for refills of her pregabalin  (LYRICA ) 75 MG capsule and HYDROcodone -acetaminophen  (NORCO) 10-325 MG tablet. Please advise.       1. LOCATION: Where does it hurt?      Upper abdomen  2. RADIATION: Does the pain shoot anywhere else? (e.[name]., chest, back)     No 3. ONSET: When did the pain begin? (e.[name]., minutes, hours or days ago)      Has been out of medication for 3 weeks 4. SUDDEN: Gradual or sudden onset?     Sudden 5. PATTERN Does the pain come and go, or is it constant?     Intermittent  6. SEVERITY: How bad is the pain?  (e.[name]., Scale 1-10; mild, moderate, or severe)     Mild to moderate  7. RECURRENT SYMPTOM: Have you ever had this type of stomach pain before? If Yes, ask: When  was the last time? and What happened that time?      Yes, history of reflux  9. CARDIAC SYMPTOMS: Do you have any of the following symptoms: chest pain, difficulty breathing, sweating, nausea?     No 10. OTHER SYMPTOMS: Do you have any other symptoms? (e.[name]., back pain, diarrhea, fever, urination pain, vomiting)       No  Protocols used: Abdominal Pain - Upper-A-AH

## 2024-06-23 NOTE — Telephone Encounter (Signed)
 Patient asking to get enough refills until her next appointment, last seen dr melvenia. Prescription Request  06/23/2024  LOV: Visit date not found  What is the name of the medication or equipment? omeprazole (PRILOSEC) 40 MG capsule [[id number]]   Have you contacted your pharmacy to request a refill? Yes   Which pharmacy would you like this sent to?  [facility]  Patient notified that their request is being sent to the clinical staff for review and that they should receive a response within 2 business days.   Please advise at Mobile [phone] (mobile)

## 2024-06-28 ENCOUNTER — Encounter: Payer: Self-pay | Admitting: Internal Medicine

## 2024-06-28 ENCOUNTER — Other Ambulatory Visit: Payer: Self-pay | Admitting: Nurse Practitioner

## 2024-06-28 ENCOUNTER — Other Ambulatory Visit: Payer: Self-pay | Admitting: Internal Medicine

## 2024-06-28 ENCOUNTER — Telehealth: Payer: Self-pay

## 2024-06-28 DIAGNOSIS — G894 Chronic pain syndrome: Secondary | ICD-10-CM

## 2024-06-29 NOTE — Telephone Encounter (Signed)
 Patient called the office with some medication concerns due to not having a PCP currently. Recommended her send a MyChart message of what she is wanting Dr. Jeannetta to do.

## 2024-06-30 ENCOUNTER — Telehealth: Payer: Self-pay

## 2024-06-30 ENCOUNTER — Other Ambulatory Visit: Payer: Self-pay | Admitting: Nurse Practitioner

## 2024-06-30 DIAGNOSIS — G894 Chronic pain syndrome: Secondary | ICD-10-CM

## 2024-06-30 MED ORDER — PREGABALIN 75 MG PO CAPS: 75.0000 mg | ORAL_CAPSULE | Freq: Three times a day (TID) | ORAL | 0 refills | Status: DC

## 2024-06-30 MED ORDER — HYDROCODONE-ACETAMINOPHEN 10-325 MG PO TABS: 1.0000 | ORAL_TABLET | Freq: Three times a day (TID) | ORAL | 0 refills | Status: DC | PRN

## 2024-06-30 NOTE — Telephone Encounter (Signed)
 Faxed to pharmacy

## 2024-06-30 NOTE — Telephone Encounter (Signed)
 Copied from CRM [id number]. Topic: Clinical - Prescription Issue >> Jun 30, 2024 10:15 AM Edsel HERO wrote: Andrea with [facility] called and said that she received a faxed rx request for HYDROcodone -acetaminophen  (NORCO) 10-325 MG tablet and pregabalin  (LYRICA ) 75 MG capsule. Andrea states that those will need to be sent electronically. She also said they need confirmation if the patient was given a written copy of the rx as well. Please advise.

## 2024-07-05 ENCOUNTER — Other Ambulatory Visit: Payer: Self-pay

## 2024-07-23 ENCOUNTER — Encounter: Payer: Self-pay | Admitting: Nurse Practitioner

## 2024-07-23 ENCOUNTER — Ambulatory Visit: Payer: Self-pay | Admitting: Nurse Practitioner

## 2024-07-23 VITALS — BP 130/73 | HR 66 | Ht 68.5 in | Wt 155.0 lb

## 2024-07-23 DIAGNOSIS — G894 Chronic pain syndrome: Secondary | ICD-10-CM | POA: Diagnosis not present

## 2024-07-23 DIAGNOSIS — E785 Hyperlipidemia, unspecified: Secondary | ICD-10-CM

## 2024-07-23 DIAGNOSIS — E1165 Type 2 diabetes mellitus with hyperglycemia: Secondary | ICD-10-CM

## 2024-07-23 DIAGNOSIS — J3089 Other allergic rhinitis: Secondary | ICD-10-CM | POA: Diagnosis not present

## 2024-07-23 DIAGNOSIS — K219 Gastro-esophageal reflux disease without esophagitis: Secondary | ICD-10-CM

## 2024-07-23 DIAGNOSIS — E1169 Type 2 diabetes mellitus with other specified complication: Secondary | ICD-10-CM

## 2024-07-23 MED ORDER — PREGABALIN 75 MG PO CAPS: 75.0000 mg | ORAL_CAPSULE | Freq: Three times a day (TID) | ORAL | 0 refills | Status: DC

## 2024-07-23 MED ORDER — OMEPRAZOLE 40 MG PO CPDR: 40.0000 mg | DELAYED_RELEASE_CAPSULE | Freq: Every morning | ORAL | 3 refills | Status: AC

## 2024-07-23 MED ORDER — CETIRIZINE HCL 10 MG PO TABS: 10.0000 mg | ORAL_TABLET | Freq: Every day | ORAL | 3 refills | Status: AC

## 2024-07-23 MED ORDER — HYDROCODONE-ACETAMINOPHEN 10-325 MG PO TABS
1.0000 | ORAL_TABLET | Freq: Three times a day (TID) | ORAL | 0 refills | Status: DC | PRN
Start: 2024-07-23 — End: 2024-09-01

## 2024-07-23 NOTE — Progress Notes (Signed)
 Established Patient Office Visit  Subjective:  Patient ID: ADAIRA CENTOLA, female    DOB: 19-Jan-1956  Age: 68 y.o. MRN: [id number]  Chief Complaint  Patient presents with   Establish Care    Previously [facility] patient    Patient here today to transfer care.  Needs two refills today.  Patient seeing ophthalmology.  Patient will set up her annual screening mammogram.  She will consider a screening colonoscopy and will let this provider know when she's ready for the consult.      No other concerns at this time.   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   Procedure: RIGHT TOTAL KNEE ARTHROPLASTY;  Surgeon: Dempsey Moan, MD;  Location: [facility];  Service: Orthopedics;  Laterality: Right;  Adductor Block    Social History   Socioeconomic History   Marital status: Married    Spouse name: Not on file   Number of children: Not on file   Years of education: Not on file   Highest education level: Not on file  Occupational History   Not on file  Tobacco Use   Smoking status: Former    Types: Cigarettes    Passive exposure: Current   Smokeless tobacco: Never  Vaping Use   Vaping status: Never Used  Substance and Sexual Activity   Alcohol use: No   Drug use: No   Sexual activity: Not on file  Other Topics Concern   Not on file  Social History Narrative   Not on file   Social Drivers of Health   Financial Resource Strain: Not on  file  Food Insecurity: Not on file  Transportation Needs: Not on file  Physical Activity: Not on file  Stress: Not on file  Social Connections: Not on file  Intimate Partner Violence: Not on file    Family History  Problem Relation Age of Onset   Cancer Mother    Liver disease Brother    Diabetes Brother    Diabetes Brother     Allergies  Allergen Reactions   Aspirin Other (See Comments)    burns my stomach   Ibuprofen     Outpatient Medications Prior to Visit  Medication Sig   albuterol  (PROVENTIL  HFA;VENTOLIN  HFA) 108 (90 Base) MCG/ACT inhaler Inhale 2 puffs into the lungs every 6 (six) hours as needed for wheezing or shortness of breath.   Ascorbic Acid (VITAMIN C PO) Take by mouth.   atorvastatin  (LIPITOR) 10 MG tablet Take 1 tablet (10 mg total) by mouth daily.   cetirizine  (ZYRTEC ) 10 MG tablet Take 10 mg by mouth daily.   cyclobenzaprine  (FLEXERIL ) 5 MG tablet Take 1 tablet (5 mg total) by mouth at bedtime as needed.   diclofenac  (VOLTAREN ) 75 MG EC tablet Take 75 mg by mouth 2 (two) times daily.   diclofenac  Sodium (VOLTAREN  ARTHRITIS PAIN) 1 % GEL Apply 2 g topically 4 (four) times daily.   erythromycin ophthalmic  ointment SMARTSIG:In Eye(s)   fluticasone  (FLONASE ) 50 MCG/ACT nasal spray PLACE 2 SPRAYS INTO BOTH NOSTRILS ONCE DAILY.   glipiZIDE  (GLUCOTROL  XL) 2.5 MG 24 hr tablet Take 1 tablet (2.5 mg total) by mouth daily with breakfast.   glucose blood (CONTOUR NEXT TEST) test strip Use as instructed to monitor glucose once daily   HYDROcodone -acetaminophen  (NORCO) 10-325 MG tablet Take 1 tablet by mouth every 8 (eight) hours as needed.   hydroxychloroquine  (PLAQUENIL ) 200 MG tablet Take 1.5 tablets (300 mg total) by mouth daily.   metFORMIN  (GLUCOPHAGE ) 500 MG tablet Take 1 tablet (500 mg total) by mouth 2 (two) times daily with a meal.   omeprazole  (PRILOSEC) 40 MG capsule Take 40 mg by mouth every morning.   ondansetron  (ZOFRAN ) 4 MG tablet Take 4 mg by mouth  every 8 (eight) hours as needed.   pregabalin  (LYRICA ) 75 MG capsule Take 1 capsule (75 mg total) by mouth 3 (three) times daily.   sitaGLIPtin  (JANUVIA ) 100 MG tablet Take 1 tablet (100 mg total) by mouth daily.   Vitamin D , Ergocalciferol , (DRISDOL ) 1.25 MG (50000 UNIT) CAPS capsule Take 1 capsule (50,000 Units total) by mouth every 7 (seven) days.   [DISCONTINUED] DULoxetine  (CYMBALTA ) 30 MG capsule Take 1 capsule (30 mg total) by mouth daily. (Patient not taking: Reported on 06/10/2024)   [DISCONTINUED] promethazine -dextromethorphan (PROMETHAZINE -DM) 6.25-15 MG/5ML syrup Take 2.5 mLs by mouth 4 (four) times daily as needed for cough. (Patient not taking: Reported on 06/10/2024)   No facility-administered medications prior to visit.    ROS     Objective:   BP 130/73   Pulse 66   Ht 5' 8.5 (1.74 m)   Wt 155 lb (70.3 kg)   BMI 23.22 kg/m   Vitals:   07/23/24 1301  BP: 130/73  Pulse: 66  Height: 5' 8.5 (1.74 m)  Weight: 155 lb (70.3 kg)  BMI (Calculated): 23.22    Physical Exam Vitals and nursing note reviewed.  Constitutional:      Appearance: Normal appearance.  HENT:     Head: Normocephalic.     Nose: Nose normal.     Mouth/Throat:     Mouth: Mucous membranes are moist.  Cardiovascular:     Rate and Rhythm: Normal rate and regular rhythm.     Pulses: Normal pulses.     Heart sounds: Normal heart sounds.  Pulmonary:     Effort: Pulmonary effort is normal.     Breath sounds: Normal breath sounds.  Musculoskeletal:        General: Tenderness present.     Cervical back: Normal range of motion and neck supple.  Skin:    General: Skin is warm and dry.  Neurological:     Mental Status: She is alert and oriented to person, place, and time.  Psychiatric:        Mood and Affect: Mood normal.        Behavior: Behavior normal.      No results found for any visits on 07/23/24.  Recent Results (from the past 2160 hours)  CBC with Differential/Platelet     Status:  Abnormal   Collection Time: 06/01/24  8:15 AM  Result Value Ref Range   WBC 6.5 3.4 - 10.8 x10E3/uL   RBC 4.26 3.77 - 5.28 x10E6/uL   Hemoglobin 12.2 11.1 - 15.9 g/dL   Hematocrit 60.8 65.9 - 46.6 %   MCV 92 79 - 97 fL   MCH 28.6 26.6 - 33.0 pg  MCHC 31.2 (L) 31.5 - 35.7 g/dL   RDW 87.5 88.2 - 84.5 %   Platelets 198 150 - 450 x10E3/uL   Neutrophils 61 Not Estab. %   Lymphs 28 Not Estab. %   Monocytes 7 Not Estab. %   Eos 3 Not Estab. %   Basos 1 Not Estab. %   Neutrophils Absolute 4.0 1.4 - 7.0 x10E3/uL   Lymphocytes Absolute 1.8 0.7 - 3.1 x10E3/uL   Monocytes Absolute 0.5 0.1 - 0.9 x10E3/uL   EOS (ABSOLUTE) 0.2 0.0 - 0.4 x10E3/uL   Basophils Absolute 0.1 0.0 - 0.2 x10E3/uL   Immature Granulocytes 0 Not Estab. %   Immature Grans (Abs) 0.0 0.0 - 0.1 x10E3/uL  Comprehensive metabolic panel with GFR     Status: Abnormal   Collection Time: 06/01/24  8:15 AM  Result Value Ref Range   Glucose 183 (H) 70 - 99 mg/dL   BUN 14 8 - 27 mg/dL   Creatinine, Ser 9.34 0.57 - 1.00 mg/dL   eGFR 96 >40 fO/fpw/8.26   BUN/Creatinine Ratio 22 12 - 28   Sodium 141 134 - 144 mmol/L   Potassium 4.3 3.5 - 5.2 mmol/L   Chloride 105 96 - 106 mmol/L   CO2 22 20 - 29 mmol/L   Calcium  9.5 8.7 - 10.3 mg/dL   Total Protein 6.1 6.0 - 8.5 g/dL   Albumin 4.2 3.9 - 4.9 g/dL   Globulin, Total 1.9 1.5 - 4.5 g/dL   Bilirubin Total 0.2 0.0 - 1.2 mg/dL   Alkaline Phosphatase 97 44 - 121 IU/L   AST 20 0 - 40 IU/L   ALT 17 0 - 32 IU/L  HM DIABETES EYE EXAM     Status: None   Collection Time: 06/09/24  7:38 AM  Result Value Ref Range   HM Diabetic Eye Exam No Retinopathy No Retinopathy    Comment: Abstracted by HIM      Assessment & Plan:  1) Chronic pain - refill on hydrocodone  2) Anxiety - refill on xanax 3) Patient will consider colonoscopy screen 4) Patient to return to clinic in 6 months, fasting labs 5) Patient sees Endocrinology   Problem List Items Addressed This Visit   None   No  follow-ups on file.   Total time spent: 25 minutes  Neale Carpen, NP  07/23/2024   This document may have been prepared by [facility] Voice Recognition software and as such may include unintentional dictation errors.

## 2024-07-23 NOTE — Patient Instructions (Addendum)
 1) Refills today 2) Fasting labs at next appt 3) Follow up appt in 6 months, will do fasting labs then

## 2024-08-04 ENCOUNTER — Ambulatory Visit: Admitting: Nurse Practitioner

## 2024-08-10 ENCOUNTER — Ambulatory Visit (INDEPENDENT_AMBULATORY_CARE_PROVIDER_SITE_OTHER): Admitting: Nurse Practitioner

## 2024-08-10 ENCOUNTER — Encounter: Payer: Self-pay | Admitting: Nurse Practitioner

## 2024-08-10 VITALS — BP 112/60 | HR 69 | Ht 68.5 in | Wt 160.0 lb

## 2024-08-10 DIAGNOSIS — E559 Vitamin D deficiency, unspecified: Secondary | ICD-10-CM

## 2024-08-10 DIAGNOSIS — E1165 Type 2 diabetes mellitus with hyperglycemia: Secondary | ICD-10-CM | POA: Diagnosis not present

## 2024-08-10 DIAGNOSIS — Z7984 Long term (current) use of oral hypoglycemic drugs: Secondary | ICD-10-CM | POA: Diagnosis not present

## 2024-08-10 LAB — POCT GLYCOSYLATED HEMOGLOBIN (HGB A1C): Hemoglobin A1C: 6.8 % — AB (ref 4.0–5.6)

## 2024-08-10 MED ORDER — GLIPIZIDE ER 2.5 MG PO TB24: 2.5000 mg | ORAL_TABLET | Freq: Every day | ORAL | 1 refills | Status: AC

## 2024-08-10 MED ORDER — SITAGLIPTIN PHOSPHATE 100 MG PO TABS: 100.0000 mg | ORAL_TABLET | Freq: Every day | ORAL | 1 refills | Status: AC

## 2024-08-10 MED ORDER — VITAMIN D (ERGOCALCIFEROL) 1.25 MG (50000 UNIT) PO CAPS: 50000.0000 [IU] | ORAL_CAPSULE | ORAL | 3 refills | Status: AC

## 2024-08-10 NOTE — Progress Notes (Signed)
 Endocrinology Follow Up Note       08/10/2024, 3:47 PM   Subjective:    Patient ID: [name], female    DOB: 01-06-1956.  [name] is being seen in follow up after being seen in consultation for management of currently uncontrolled symptomatic diabetes requested by  Glennon Sand, NP.   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   Procedure: RIGHT TOTAL KNEE ARTHROPLASTY;  Surgeon: Dempsey Moan, MD;  Location: [facility];  Service: Orthopedics;  Laterality: Right;  Adductor Block    Social History   Socioeconomic History   Marital status: Married    Spouse name: Not on file   Number of children: Not on file   Years of education: Not on file   Highest education level: Not on file  Occupational History   Not on file  Tobacco Use   Smoking status: Former    Types: Cigarettes    Passive exposure: Current   Smokeless tobacco: Never  Vaping Use   Vaping status: Never Used  Substance and Sexual Activity   Alcohol use: No   Drug use: No   Sexual activity: Not on file  Other Topics Concern   Not on file  Social History Narrative   Not on file   Social Drivers of Health   Financial Resource Strain: Not on file  Food Insecurity: Not on file  Transportation Needs: Not on file  Physical Activity: Not on file  Stress: Not on file  Social Connections:  Not on file    Family History  Problem Relation Age of Onset   Cancer Mother    Liver disease Brother    Diabetes Brother    Diabetes Brother     Outpatient Encounter Medications as of 08/10/2024  Medication Sig   albuterol  (PROVENTIL  HFA;VENTOLIN  HFA) 108 (90 Base) MCG/ACT inhaler Inhale 2 puffs into the lungs every 6 (six) hours as needed for wheezing or shortness of breath.   Ascorbic Acid (VITAMIN C PO) Take by mouth.   atorvastatin  (LIPITOR) 10 MG tablet Take 1 tablet (10 mg total) by mouth daily.   cetirizine  (ZYRTEC ) 10 MG tablet Take 1 tablet (10 mg total) by mouth daily.   cyclobenzaprine  (FLEXERIL ) 5 MG tablet Take 1 tablet (5 mg total) by mouth at bedtime as needed.   diclofenac  (VOLTAREN ) 75 MG EC tablet Take 75 mg by mouth 2 (two) times daily. (Patient taking differently: Take 75 mg by mouth 2 (two) times daily. Patient takes as needed)   diclofenac  Sodium (VOLTAREN  ARTHRITIS PAIN) 1 % GEL Apply 2 g topically 4 (four) times daily.  erythromycin ophthalmic ointment SMARTSIG:In Eye(s) (Patient taking differently: as needed.)   fluticasone  (FLONASE ) 50 MCG/ACT nasal spray PLACE 2 SPRAYS INTO BOTH NOSTRILS ONCE DAILY.   glucose blood (CONTOUR NEXT TEST) test strip Use as instructed to monitor glucose once daily   HYDROcodone -acetaminophen  (NORCO) 10-325 MG tablet Take 1 tablet by mouth every 8 (eight) hours as needed.   hydroxychloroquine  (PLAQUENIL ) 200 MG tablet Take 1.5 tablets (300 mg total) by mouth daily.   metFORMIN  (GLUCOPHAGE ) 500 MG tablet Take 1 tablet (500 mg total) by mouth 2 (two) times daily with a meal.   omeprazole  (PRILOSEC) 40 MG capsule Take 1 capsule (40 mg total) by mouth every morning.   ondansetron  (ZOFRAN ) 4 MG tablet Take 4 mg by mouth every 8 (eight) hours as needed.   pregabalin  (LYRICA ) 75 MG capsule Take 1 capsule (75 mg total) by mouth 3 (three) times daily.   [DISCONTINUED] glipiZIDE  (GLUCOTROL  XL) 2.5 MG 24 hr tablet Take 1 tablet (2.5 mg total)  by mouth daily with breakfast.   [DISCONTINUED] sitaGLIPtin  (JANUVIA ) 100 MG tablet Take 1 tablet (100 mg total) by mouth daily.   [DISCONTINUED] Vitamin D , Ergocalciferol , (DRISDOL ) 1.25 MG (50000 UNIT) CAPS capsule Take 1 capsule (50,000 Units total) by mouth every 7 (seven) days.   glipiZIDE  (GLUCOTROL  XL) 2.5 MG 24 hr tablet Take 1 tablet (2.5 mg total) by mouth daily with breakfast.   sitaGLIPtin  (JANUVIA ) 100 MG tablet Take 1 tablet (100 mg total) by mouth daily.   Vitamin D , Ergocalciferol , (DRISDOL ) 1.25 MG (50000 UNIT) CAPS capsule Take 1 capsule (50,000 Units total) by mouth every 7 (seven) days.   No facility-administered encounter medications on file as of 08/10/2024.    ALLERGIES: Allergies  Allergen Reactions   Aspirin Other (See Comments)    burns my stomach   Ibuprofen     VACCINATION STATUS: Immunization History  Administered Date(s) Administered   Fluad Trivalent(High Dose 65+) 11/10/2023   Rabies, IM 01/11/2023   Tdap 06/28/2015, 01/11/2023    Diabetes She presents for her follow-up diabetic visit. She has type 2 diabetes mellitus. Onset time: diagnosed at approx age of 9. Her disease course has been improving. There are no hypoglycemic associated symptoms. Associated symptoms include fatigue. Pertinent negatives for diabetes include no polydipsia and no weight loss. There are no hypoglycemic complications. Symptoms are stable. Diabetic complications include heart disease. Risk factors for coronary artery disease include diabetes mellitus, dyslipidemia and family history. Current diabetic treatment includes oral agent (monotherapy). She is compliant with treatment most of the time. Her weight is decreasing steadily. She is following a generally healthy diet. When asked about meal planning, she reported none. She has not had a previous visit with a dietitian. She participates in exercise intermittently. Her home blood glucose trend is decreasing steadily. Her breakfast  blood glucose range is generally 110-130 mg/dl. Her overall blood glucose range is 110-130 mg/dl. (She presents today with her meter, no logs, showing at target glycemic profile overall.  Her POCT A1c today is 6.8%, improving from last visit of 9.1%.  She has readings ranging from 93-151.  ) She does not see a podiatrist.Eye exam is current.    Review of systems  Constitutional: +increasing body weight,  current Body mass index is 23.97 kg/m. , no fatigue, no subjective hyperthermia, no subjective hypothermia Eyes: no blurry vision, no xerophthalmia ENT: no sore throat, no nodules palpated in throat, no dysphagia/odynophagia, no hoarseness Cardiovascular: no chest pain, no shortness of breath, no palpitations, no  leg swelling Respiratory: no cough, no shortness of breath Gastrointestinal: no nausea/vomiting/diarrhea Musculoskeletal: + diffuse muscle/joint aches (seeing Rheumatology)- recently had steroid injection, will intermittently take oral steroids as well Skin: no rashes, no hyperemia Neurological: no tremors, no numbness, no tingling, no dizziness Psychiatric: no depression, no anxiety  Objective:     BP 112/60 (BP Location: Left Arm, Patient Position: Sitting, Cuff Size: Large)   Pulse 69   Ht 5' 8.5 (1.74 m)   Wt 160 lb (72.6 kg)   BMI 23.97 kg/m   Wt Readings from Last 3 Encounters:  08/10/24 160 lb (72.6 kg)  07/23/24 155 lb (70.3 kg)  06/10/24 154 lb (69.9 kg)     BP Readings from Last 3 Encounters:  08/10/24 112/60  07/23/24 130/73  06/10/24 (!) 90/52      Physical Exam- Limited  Constitutional:  Body mass index is 23.97 kg/m. , not in acute distress, normal state of mind Eyes:  EOMI, no exophthalmos Musculoskeletal: no gross deformities, strength intact in all four extremities, no gross restriction of joint movements Skin:  no rashes, no hyperemia Neurological: no tremor with outstretched hands   Diabetic Foot Exam - Simple   No data filed     CMP  ( most recent) CMP     Component Value Date/Time   NA 141 06/01/2024 0815   K 4.3 06/01/2024 0815   CL 105 06/01/2024 0815   CO2 22 06/01/2024 0815   GLUCOSE 183 (H) 06/01/2024 0815   GLUCOSE 147 (H) 01/03/2023 1405   BUN 14 06/01/2024 0815   CREATININE 0.65 06/01/2024 0815   CALCIUM  9.5 06/01/2024 0815   PROT 6.1 06/01/2024 0815   ALBUMIN 4.2 06/01/2024 0815   AST 20 06/01/2024 0815   ALT 17 06/01/2024 0815   ALKPHOS 97 06/01/2024 0815   BILITOT 0.2 06/01/2024 0815   EGFR 96 06/01/2024 0815   GFRNONAA >60 01/03/2023 1405     Diabetic Labs (most recent): Lab Results  Component Value Date   HGBA1C 6.8 (A) 08/10/2024   HGBA1C 9.1 (A) 03/31/2024   HGBA1C 8.2 (A) 01/01/2024   MICROALBUR 30 mg/L 09/29/2023     Lipid Panel ( most recent) Lipid Panel     Component Value Date/Time   CHOL 106 02/11/2024 1602   TRIG 138 02/11/2024 1602   HDL 34 (L) 02/11/2024 1602   CHOLHDL 3.1 02/11/2024 1602   LDLCALC 48 02/11/2024 1602   LABVLDL 24 02/11/2024 1602      Lab Results  Component Value Date   TSH 0.461 10/03/2023   TSH 0.26 (A) 02/18/2023   FREET4 1.32 10/03/2023           Assessment & Plan:   1) Type 2 diabetes mellitus with hyperglycemia, without long-term current use of insulin  (HCC)  She presents today with her meter, no logs, showing at target glycemic profile overall.  Her POCT A1c today is 6.8%, improving from last visit of 9.1%.  She has readings ranging from 93-151. She denies any hypoglycemia.  - [name] has currently uncontrolled symptomatic type 2 DM since 68 years of age.   -Recent labs reviewed.  - I had a long discussion with her about the progressive nature of diabetes and the pathology behind its complications. -her diabetes is complicated by CAD and chronic intermittent use of steroids and she remains at a high risk for more acute and chronic complications which include CAD, CVA, CKD, retinopathy, and neuropathy. These are all discussed  in detail with  her.  The following Lifestyle Medicine recommendations according to American College of Lifestyle Medicine [facility]) were discussed and offered to patient and she agrees to start the journey:  A. Whole Foods, Plant-based plate comprising of fruits and vegetables, plant-based proteins, whole-grain carbohydrates was discussed in detail with the patient.   A list for source of those nutrients were also provided to the patient.  Patient will use only water  or unsweetened tea for hydration. B.  The need to stay away from risky substances including alcohol, smoking; obtaining 7 to 9 hours of restorative sleep, at least 150 minutes of moderate intensity exercise weekly, the importance of healthy social connections,  and stress reduction techniques were discussed. C.  A full color page of  Calorie density of various food groups per pound showing examples of each food groups was provided to the patient.  - Nutritional counseling repeated at each appointment due to patients tendency to fall back in to old habits.  - The patient admits there is a room for improvement in their diet and drink choices. -  Suggestion is made for the patient to avoid simple carbohydrates from their diet including Cakes, Sweet Desserts / Pastries, Ice Cream, Soda (diet and regular), Sweet Tea, Candies, Chips, Cookies, Sweet Pastries, Store Bought Juices, Alcohol in Excess of 1-2 drinks a day, Artificial Sweeteners, Coffee Creamer, and Sugar-free Products. This will help patient to have stable blood glucose profile and potentially avoid unintended weight gain.   - I encouraged the patient to switch to unprocessed or minimally processed complex starch and increased protein intake (animal or plant source), fruits, and vegetables.   - Patient is advised to stick to a routine mealtimes to eat 3 meals a day and avoid unnecessary snacks (to snack only to correct hypoglycemia).  - I have approached her with the following  individualized plan to manage her diabetes and patient agrees:   -Due to improved glycemic profile, no changes will be made to her regimen today.  She is advised to continue Metformin  500 mg po twice daily after meals (cannot tolerate higher doses due to GI SE), Januvia  100 mg po daily, and Glipizide  2.5 mg XL daily with breakfast.    -she is encouraged to continue monitoring glucose once daily, before breakfast, and to call the clinic if she has readings less than 70 or above 300 for 3 tests in a row.  - Adjustment parameters are given to her for hypo and hyperglycemia in writing.  - her Ozempic was previously discontinued, risk outweighs benefit for this patient.  she is not an ideal candidate for incretin therapy given body habitus and BMI <25.   - Specific targets for  A1c; LDL, HDL, and Triglycerides were discussed with the patient.  2) Blood Pressure /Hypertension:  her blood pressure is controlled to target without the use of antihypertensive medications.    3) Lipids/Hyperlipidemia:    Review of her recent lipid panel from 02/11/24 showed controlled LDL at 48 .  She is advised to continue Lipitor 10 mg po daily.    4)  Weight/Diet:  her Body mass index is 23.97 kg/m.  -  she is NOT a candidate for weight loss. I discussed with her the fact that loss of 5 - 10% of her  current body weight will have the most impact on her diabetes management.  Exercise, and detailed carbohydrates information provided  -  detailed on discharge instructions.  5) Chronic Care/Health Maintenance: -she is not on ACEI/ARB or Statin  medications and is encouraged to initiate and continue to follow up with Ophthalmology, Dentist, Podiatrist at least yearly or according to recommendations, and advised to stay away from smoking. I have recommended yearly flu vaccine and pneumonia vaccine at least every 5 years; moderate intensity exercise for up to 150 minutes weekly; and sleep for at least 7 hours a day.  6)  Abnormal TSH-resolved Her repeat thyroid  labs were normal.  Thyroid  antibodies were negative, ruling out autoimmune thyroid  dysfunction.  7)Fatigue Her recent vitamin D  level was 25.7 on 10/03/23.  She was started on Ergocalciferol .  She notes this has helped with her energy level some, will continue this.   - she is advised to maintain close follow up with Glennon Sand, NP for primary care needs, as well as her other providers for optimal and coordinated care.     I spent  41  minutes in the care of the patient today including review of labs from CMP, Lipids, Thyroid  Function, Hematology (current and previous including abstractions from other facilities); face-to-face time discussing  her blood glucose readings/logs, discussing hypoglycemia and hyperglycemia episodes and symptoms, medications doses, her options of short and long term treatment based on the latest standards of care / guidelines;  discussion about incorporating lifestyle medicine;  and documenting the encounter. Risk reduction counseling performed per USPSTF guidelines to reduce obesity and cardiovascular risk factors.     Please refer to Patient Instructions for Blood Glucose Monitoring and Insulin /Medications Dosing Guide  in media tab for additional information. Please  also refer to  Patient Self Inventory in the Media  tab for reviewed elements of pertinent patient history.  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  All questions were answered to her satisfaction. she is encouraged to contact clinic should she have any questions or concerns prior to her return visit.     Follow up plan: - Return in about 6 months (around 02/07/2025) for Diabetes F/U with A1c in office, No previsit labs, Bring meter and logs.   Benton Rio, [facility] [address] [place], [place] [address] Phone: [phone] Fax:  [phone]  08/10/2024, 3:47 PM

## 2024-08-13 ENCOUNTER — Ambulatory Visit: Admitting: Internal Medicine

## 2024-08-27 ENCOUNTER — Other Ambulatory Visit: Payer: Self-pay | Admitting: Nurse Practitioner

## 2024-08-27 DIAGNOSIS — G894 Chronic pain syndrome: Secondary | ICD-10-CM

## 2024-08-30 ENCOUNTER — Encounter: Payer: Self-pay | Admitting: Nurse Practitioner

## 2024-08-31 NOTE — Progress Notes (Deleted)
 Office Visit Note  Patient: [name]             Date of Birth: [birth year]           MRN: [id number]             PCP: Glennon Sand, NP Referring: No ref. provider found Visit Date: 09/14/2024   Subjective:  No chief complaint on file.   History of Present Illness: [name] is a 68 y.o. female here for follow up with rheumatoid arthritis and osteoarthritis on HCQ 400 mg daily.    Previous HPI 06/10/2024 [name] is a 68 y.o. female here for follow up with rheumatoid arthritis and osteoarthritis on HCQ 400 mg daily. She presents with joint pain and stiffness.     She experiences persistent joint pain, particularly in her knees, ankles, toes, elbows, and back. The pain is described as 'never ending' and significantly affects her ability to sleep, requiring her to be 'dead tired' to fall asleep. Restless leg syndrome exacerbates her discomfort, especially when her ankles are affected, and her right toes were particularly painful last night. The pain sometimes radiates to her arm and elbow.   Her current medication regimen includes Flexeril  as needed for pain relief, but she is cautious about its use due to its lasting effects the next day, which interfere with her ability to operate machinery. Although Cymbalta  helps, she has not been taking it due to concerns about its side effects. She uses diclofenac  regularly, which she finds effective, but notes it also has lasting effects into the next day. She has been taking hydroxychloroquine , which was prescribed at 1 tablet daily but has been taking it 2 tablets daily.   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 She notes that her knee swells and is 'heating up with arthritis,' and she uses a cushion to avoid putting pressure on it. She reports previous knee injection in April provided significant relief, making her feel better overall.   She denies  any recent illnesses but feels 'dragging' and tired. She has not been sleeping well for the past two years, although she used to sleep fine before.     Previous HPI 03/11/2024 [name] is a 68 year old female with rheumatoid arthritis and osteoarthritis who presents with joint pain and stiffness.   She experiences joint pain and stiffness, primarily affecting her hands, knees, and hips, with a history of rheumatoid arthritis. The symptoms are persistent, with a deep ache and stiffness, especially in the morning. Her elbows are also affected, although elbow pain is less commonly reported. Recent blood tests show a low positive anti-CCP antibody level and a slightly elevated CRP at 10.4. X-rays of her hands reveal no erosive joint damage but indicate wear and tear arthritis, particularly at the tips of her fingers. Her bone density in the hands appears low, although she has not had a DEXA scan.   She reports significant pain and functional limitation in her left knee, diagnosed with osteoarthritis. X-rays reveal bone spurs and narrowing of the joint space, with severe arthritis in the front compartment. She experiences difficulty with knee movement, especially when getting up and down, and describes the pain as 'giving her a fit.' Despite the pain, she remains active, walking frequently and performing daily tasks.   She is concerned about her hip pain, initially thought to be related to her back,  as the pain radiates and sometimes prevents her from lying on one side. She remains active in her daily life, often performing physically demanding tasks.   Her family history includes a mother, aged 58, with a history of strong bones, having never broken a bone despite numerous falls.      Previous HPI 02/09/24 [name] is a 68 year old female with history of rheumatoid arthritis who presents with worsening joint pain and difficulty managing symptoms.   She has a history of rheumatoid arthritis  and has been taking hydroxychloroquine  (Plaquenil ) for years. Recently, she stopped taking it due to a change in doctors and feels worse without it. Her body feels 'sore', and she has difficulty getting started in the morning, with symptoms worsening over time. She experiences significant pain in her shoulders, hips, back, and neck, affecting her sleep, causing her to toss and turn due to discomfort. She also reports pain in her wrists and knees, with a history of a knee fracture two years ago that healed on its own. Her hip pain has been severe for about six months, and she has not had it x-rayed yet.   She has a history of a torn rotator cuff and neck issues, with x-rays done on her neck. She feels a 'crunchy' sensation in her neck and back. She has been diagnosed with a partial rotator cuff tear and has received shoulder injections, which she found helpful. 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 She takes Lyrica , which she finds essential for managing her shoulder pain, but has difficulty getting it prescribed consistently.   She has been told she has fibromyalgia in the past and finds Lyrica  helpful for this condition as well. She experiences muscle weakness and was found to have low vitamin D  levels, which improved with supplementation. She takes Voltaren , which helps with pain but causes sleepiness and stomach issues, requiring her to take nausea medication first. She takes it only when desperate, as it provides significant relief despite the side effects.   She has diabetes, which she finds difficult to control. Her diabetes doctor suggested that lack of exercise or pain might be contributing factors. Despite this, she is physically active, working daily on a farm. She experiences twitching and jerking at night, and her hands and feet sometimes feel 'funny' or don't 'get the message' to function properly. No significant  swelling except for a past episode during the COVID shutdown, which was painful.   She has not had a bone density scan but believes her bones are strong due to lifelong milk consumption. She has not been diagnosed with osteoporosis. She reports that her leg was injured by a calf, resulting in a fracture that was painful but healed without significant intervention.    No Rheumatology ROS completed.   PMFS History:  Patient Active Problem List   Diagnosis Date Noted   Persistent cough 02/11/2024   Insomnia 11/10/2023   Vitamin D  insufficiency 11/10/2023   Need for influenza vaccination 11/10/2023   Type 2 diabetes mellitus with hyperglycemia (HCC) 08/05/2023   Seronegative rheumatoid arthritis (HCC) 08/05/2023   Allergic rhinitis 08/05/2023   MDD (major depressive disorder) 08/05/2023   Chronic pain syndrome 08/05/2023   Hyperlipidemia associated with type 2 diabetes mellitus (HCC) 08/05/2023   Breast cancer screening by mammogram 08/05/2023   OA (osteoarthritis) of knee 01/13/2017    Past Medical History:  Diagnosis Date   Arthritis    Diabetes  mellitus without complication (HCC)    PONV (postoperative nausea and vomiting)    Pulmonary embolism (HCC) 2005    Family History  Problem Relation Age of Onset   Cancer Mother    Liver disease Brother    Diabetes Brother    Diabetes Brother    Past Surgical History:  Procedure Laterality Date   ABDOMINAL HYSTERECTOMY     CARPAL TUNNEL RELEASE Left 2000   CARPAL TUNNEL RELEASE Right 2000   CATARACT EXTRACTION W/PHACO Right 11/21/2014   Procedure: CATARACT EXTRACTION PHACO AND INTRAOCULAR LENS PLACEMENT (IOC);  Surgeon: Cherene Mania, MD;  Location: [facility];  Service: Ophthalmology;  Laterality: Right;  CDE:5.78   TOTAL KNEE ARTHROPLASTY Right 01/13/2017   Procedure: RIGHT TOTAL KNEE ARTHROPLASTY;  Surgeon: Dempsey Moan, MD;  Location: [facility];  Service: Orthopedics;  Laterality: Right;  Adductor Block   Social History   Social  History Narrative   Not on file   Immunization History  Administered Date(s) Administered   Fluad Trivalent(High Dose 65+) 11/10/2023   Rabies, IM 01/11/2023   Tdap 06/28/2015, 01/11/2023     Objective: Vital Signs: There were no vitals taken for this visit.   Physical Exam   Musculoskeletal Exam: ***  CDAI Exam: CDAI Score: -- Patient Global: --; Provider Global: -- Swollen: --; Tender: -- Joint Exam 09/14/2024   No joint exam has been documented for this visit   There is currently no information documented on the homunculus. Go to the Rheumatology activity and complete the homunculus joint exam.  Investigation: No additional findings.  Imaging: No results found.  Recent Labs: Lab Results  Component Value Date   WBC 6.5 06/01/2024   HGB 12.2 06/01/2024   PLT 198 06/01/2024   NA 141 06/01/2024   K 4.3 06/01/2024   CL 105 06/01/2024   CO2 22 06/01/2024   GLUCOSE 183 (H) 06/01/2024   BUN 14 06/01/2024   CREATININE 0.65 06/01/2024   BILITOT 0.2 06/01/2024   ALKPHOS 97 06/01/2024   AST 20 06/01/2024   ALT 17 06/01/2024   PROT 6.1 06/01/2024   ALBUMIN 4.2 06/01/2024   CALCIUM  9.5 06/01/2024   GFRAA >60 01/15/2017    Speciality Comments: [facility]  Dr Inocente will get a plaquenil  exam in January.  Procedures:  No procedures performed Allergies: Aspirin and Ibuprofen   Assessment / Plan:     Visit Diagnoses: No diagnosis found.  ***  Orders: No orders of the defined types were placed in this encounter.  No orders of the defined types were placed in this encounter.    Follow-Up Instructions: No follow-ups on file.   Johnnie Goynes M Luisalberto Beegle, CMA  Note - This record has been created using Animal nutritionist.  Chart creation errors have been sought, but may not always  have been located. Such creation errors do not reflect on  the standard of medical care.

## 2024-09-01 ENCOUNTER — Other Ambulatory Visit: Payer: Self-pay

## 2024-09-01 DIAGNOSIS — G894 Chronic pain syndrome: Secondary | ICD-10-CM

## 2024-09-01 MED ORDER — HYDROCODONE-ACETAMINOPHEN 10-325 MG PO TABS: 1.0000 | ORAL_TABLET | Freq: Three times a day (TID) | ORAL | 0 refills | Status: DC | PRN

## 2024-09-01 MED ORDER — PREGABALIN 75 MG PO CAPS: 75.0000 mg | ORAL_CAPSULE | Freq: Three times a day (TID) | ORAL | 0 refills | Status: DC

## 2024-09-14 ENCOUNTER — Ambulatory Visit: Admitting: Internal Medicine

## 2024-09-14 DIAGNOSIS — M0600 Rheumatoid arthritis without rheumatoid factor, unspecified site: Secondary | ICD-10-CM

## 2024-09-14 DIAGNOSIS — M25552 Pain in left hip: Secondary | ICD-10-CM

## 2024-09-14 DIAGNOSIS — M1712 Unilateral primary osteoarthritis, left knee: Secondary | ICD-10-CM

## 2024-09-14 DIAGNOSIS — Z79899 Other long term (current) drug therapy: Secondary | ICD-10-CM

## 2024-09-29 ENCOUNTER — Other Ambulatory Visit: Payer: Self-pay

## 2024-09-29 DIAGNOSIS — G894 Chronic pain syndrome: Secondary | ICD-10-CM

## 2024-10-08 ENCOUNTER — Other Ambulatory Visit (HOSPITAL_COMMUNITY): Payer: Self-pay

## 2024-10-08 DIAGNOSIS — Z1231 Encounter for screening mammogram for malignant neoplasm of breast: Secondary | ICD-10-CM

## 2024-10-11 ENCOUNTER — Telehealth: Payer: Self-pay

## 2024-10-11 ENCOUNTER — Other Ambulatory Visit: Payer: Self-pay | Admitting: Internal Medicine

## 2024-10-11 ENCOUNTER — Telehealth: Payer: Self-pay | Admitting: *Deleted

## 2024-10-11 DIAGNOSIS — M0600 Rheumatoid arthritis without rheumatoid factor, unspecified site: Secondary | ICD-10-CM

## 2024-10-11 MED ORDER — PREDNISONE 5 MG PO TABS: ORAL_TABLET | ORAL | 0 refills | Status: AC

## 2024-10-11 NOTE — Telephone Encounter (Signed)
 Last Fill: 06/10/2024  Eye exam: not on file.    Labs: 06/01/2024 Blood count and kidney and liver function tests are normal. Her blood sugar was 183 but this was a random so is not high enough to be a concern.   Next Visit: 11/01/2024  Last Visit: 06/10/2024  DX: Seronegative rheumatoid arthritis   Current Dose per office note 06/10/2024: hydroxychloroquine  at 200 mg daily   Left message to advise patient she needs update PLQ eye exam.   Okay to refill Plaquenil ?

## 2024-10-11 NOTE — Telephone Encounter (Signed)
 Patient advised Dr. Jeannetta sent a prescription for 12-day prednisone taper to her pharmacy. Patient expressed understanding.

## 2024-10-11 NOTE — Telephone Encounter (Signed)
 Patient contacted the office and states she was having pain and swelling in her left hand and wrist. Patient states the swelling went down but she is still having a lot of pain. Patient inquired if she could get Prednisone sent to [facility]. Please advise.

## 2024-10-11 NOTE — Telephone Encounter (Signed)
 Patient contacted the office and left message stating her wrist has been swollen for 10 days.  Attempted to contact the patient and unable to leave a message, voicemail is full.

## 2024-10-11 NOTE — Addendum Note (Signed)
 Addended by: JEANNETTA LONNI ORN on: 10/11/2024 01:59 PM   Modules accepted: Orders

## 2024-10-11 NOTE — Telephone Encounter (Signed)
 I sent a prescription for 12-day prednisone taper to her pharmacy.

## 2024-10-13 ENCOUNTER — Encounter (HOSPITAL_COMMUNITY): Payer: Self-pay

## 2024-10-13 ENCOUNTER — Ambulatory Visit (HOSPITAL_COMMUNITY)
Admission: RE | Admit: 2024-10-13 | Discharge: 2024-10-13 | Disposition: A | Discharge status: Home / Self Care | Source: Ambulatory Visit

## 2024-10-13 DIAGNOSIS — Z1231 Encounter for screening mammogram for malignant neoplasm of breast: Secondary | ICD-10-CM | POA: Insufficient documentation

## 2024-10-18 NOTE — Progress Notes (Deleted)
 Office Visit Note  Patient: [name]             Date of Birth: 1956/01/02           MRN: [id number]             PCP: Glennon Sand, NP (Inactive) Referring: No ref. provider found Visit Date: 11/01/2024   Subjective:  No chief complaint on file.   History of Present Illness: [name] is a 68 y.o. female here for follow up with rheumatoid arthritis and osteoarthritis on HCQ 400 mg daily.   Previous HPI 06/10/2024 [name] is a 68 y.o. female here for follow up with rheumatoid arthritis and osteoarthritis on HCQ 400 mg daily. She presents with joint pain and stiffness.     She experiences persistent joint pain, particularly in her knees, ankles, toes, elbows, and back. The pain is described as 'never ending' and significantly affects her ability to sleep, requiring her to be 'dead tired' to fall asleep. Restless leg syndrome exacerbates her discomfort, especially when her ankles are affected, and her right toes were particularly painful last night. The pain sometimes radiates to her arm and elbow.   Her current medication regimen includes Flexeril  as needed for pain relief, but she is cautious about its use due to its lasting effects the next day, which interfere with her ability to operate machinery. Although Cymbalta  helps, she has not been taking it due to concerns about its side effects. She uses diclofenac  regularly, which she finds effective, but notes it also has lasting effects into the next day. She has been taking hydroxychloroquine , which was prescribed at 1 tablet daily but has been taking it 2 tablets daily.   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 She notes that her knee swells and is 'heating up with arthritis,' and she uses a cushion to avoid putting pressure on it. She reports previous knee injection in April provided significant relief, making her feel better overall.   She  denies any recent illnesses but feels 'dragging' and tired. She has not been sleeping well for the past two years, although she used to sleep fine before.     Previous HPI 03/11/2024 [name] is a 68 year old female with rheumatoid arthritis and osteoarthritis who presents with joint pain and stiffness.   She experiences joint pain and stiffness, primarily affecting her hands, knees, and hips, with a history of rheumatoid arthritis. The symptoms are persistent, with a deep ache and stiffness, especially in the morning. Her elbows are also affected, although elbow pain is less commonly reported. Recent blood tests show a low positive anti-CCP antibody level and a slightly elevated CRP at 10.4. X-rays of her hands reveal no erosive joint damage but indicate wear and tear arthritis, particularly at the tips of her fingers. Her bone density in the hands appears low, although she has not had a DEXA scan.   She reports significant pain and functional limitation in her left knee, diagnosed with osteoarthritis. X-rays reveal bone spurs and narrowing of the joint space, with severe arthritis in the front compartment. She experiences difficulty with knee movement, especially when getting up and down, and describes the pain as 'giving her a fit.' Despite the pain, she remains active, walking frequently and performing daily tasks.   She is concerned about her hip pain, initially thought to be related to her back,  as the pain radiates and sometimes prevents her from lying on one side. She remains active in her daily life, often performing physically demanding tasks.   Her family history includes a mother, aged 37, with a history of strong bones, having never broken a bone despite numerous falls.      Previous HPI 02/09/24 [name] is a 68 year old female with history of rheumatoid arthritis who presents with worsening joint pain and difficulty managing symptoms.   She has a history of rheumatoid  arthritis and has been taking hydroxychloroquine  (Plaquenil ) for years. Recently, she stopped taking it due to a change in doctors and feels worse without it. Her body feels 'sore', and she has difficulty getting started in the morning, with symptoms worsening over time. She experiences significant pain in her shoulders, hips, back, and neck, affecting her sleep, causing her to toss and turn due to discomfort. She also reports pain in her wrists and knees, with a history of a knee fracture two years ago that healed on its own. Her hip pain has been severe for about six months, and she has not had it x-rayed yet.   She has a history of a torn rotator cuff and neck issues, with x-rays done on her neck. She feels a 'crunchy' sensation in her neck and back. She has been diagnosed with a partial rotator cuff tear and has received shoulder injections, which she found helpful. 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 She takes Lyrica , which she finds essential for managing her shoulder pain, but has difficulty getting it prescribed consistently.   She has been told she has fibromyalgia in the past and finds Lyrica  helpful for this condition as well. She experiences muscle weakness and was found to have low vitamin D  levels, which improved with supplementation. She takes Voltaren , which helps with pain but causes sleepiness and stomach issues, requiring her to take nausea medication first. She takes it only when desperate, as it provides significant relief despite the side effects.   She has diabetes, which she finds difficult to control. Her diabetes doctor suggested that lack of exercise or pain might be contributing factors. Despite this, she is physically active, working daily on a farm. She experiences twitching and jerking at night, and her hands and feet sometimes feel 'funny' or don't 'get the message' to function properly. No  significant swelling except for a past episode during the COVID shutdown, which was painful.   She has not had a bone density scan but believes her bones are strong due to lifelong milk consumption. She has not been diagnosed with osteoporosis. She reports that her leg was injured by a calf, resulting in a fracture that was painful but healed without significant intervention.    No Rheumatology ROS completed.   PMFS History:  Patient Active Problem List   Diagnosis Date Noted   Persistent cough 02/11/2024   Insomnia 11/10/2023   Vitamin D  insufficiency 11/10/2023   Need for influenza vaccination 11/10/2023   Type 2 diabetes mellitus with hyperglycemia (HCC) 08/05/2023   Seronegative rheumatoid arthritis (HCC) 08/05/2023   Allergic rhinitis 08/05/2023   MDD (major depressive disorder) 08/05/2023   Chronic pain syndrome 08/05/2023   Hyperlipidemia associated with type 2 diabetes mellitus (HCC) 08/05/2023   Breast cancer screening by mammogram 08/05/2023   OA (osteoarthritis) of knee 01/13/2017    Past Medical History:  Diagnosis Date   Arthritis    Diabetes  mellitus without complication (HCC)    PONV (postoperative nausea and vomiting)    Pulmonary embolism (HCC) 2005    Family History  Problem Relation Age of Onset   Cancer Mother    Liver disease Brother    Diabetes Brother    Diabetes Brother    Past Surgical History:  Procedure Laterality Date   ABDOMINAL HYSTERECTOMY     BREAST BIOPSY Left    CARPAL TUNNEL RELEASE Left 12/09/1998   CARPAL TUNNEL RELEASE Right 12/09/1998   CATARACT EXTRACTION W/PHACO Right 11/21/2014   Procedure: CATARACT EXTRACTION PHACO AND INTRAOCULAR LENS PLACEMENT (IOC);  Surgeon: Cherene Mania, MD;  Location: [facility];  Service: Ophthalmology;  Laterality: Right;  CDE:5.78   TOTAL KNEE ARTHROPLASTY Right 01/13/2017   Procedure: RIGHT TOTAL KNEE ARTHROPLASTY;  Surgeon: Dempsey Moan, MD;  Location: [facility];  Service: Orthopedics;  Laterality: Right;   Adductor Block   Social History   Social History Narrative   Not on file   Immunization History  Administered Date(s) Administered   Fluad Trivalent(High Dose 65+) 11/10/2023   Rabies, IM 01/11/2023   Tdap 06/28/2015, 01/11/2023     Objective: Vital Signs: There were no vitals taken for this visit.   Physical Exam   Musculoskeletal Exam: ***  CDAI Exam: CDAI Score: -- Patient Global: --; Provider Global: -- Swollen: --; Tender: -- Joint Exam 11/01/2024   No joint exam has been documented for this visit   There is currently no information documented on the homunculus. Go to the Rheumatology activity and complete the homunculus joint exam.  Investigation: No additional findings.  Imaging: MM 3D SCREENING MAMMOGRAM BILATERAL BREAST Result Date: 10/15/2024 CLINICAL DATA:  Screening. EXAM: DIGITAL SCREENING BILATERAL MAMMOGRAM WITH TOMOSYNTHESIS AND CAD TECHNIQUE: Bilateral screening digital craniocaudal and mediolateral oblique mammograms were obtained. Bilateral screening digital breast tomosynthesis was performed. The images were evaluated with computer-aided detection. COMPARISON:  Previous exam(s). ACR Breast Density Category b: There are scattered areas of fibroglandular density. FINDINGS: There are no findings suspicious for malignancy. IMPRESSION: No mammographic evidence of malignancy. A result letter of this screening mammogram will be mailed directly to the patient. RECOMMENDATION: Screening mammogram in one year. (Code:SM-B-01Y) BI-RADS CATEGORY  1: Negative. Electronically Signed   By: Curtistine Noble   On: 10/15/2024 15:05    Recent Labs: Lab Results  Component Value Date   WBC 6.5 06/01/2024   HGB 12.2 06/01/2024   PLT 198 06/01/2024   NA 141 06/01/2024   K 4.3 06/01/2024   CL 105 06/01/2024   CO2 22 06/01/2024   GLUCOSE 183 (H) 06/01/2024   BUN 14 06/01/2024   CREATININE 0.65 06/01/2024   BILITOT 0.2 06/01/2024   ALKPHOS 97 06/01/2024   AST 20  06/01/2024   ALT 17 06/01/2024   PROT 6.1 06/01/2024   ALBUMIN 4.2 06/01/2024   CALCIUM  9.5 06/01/2024   GFRAA >60 01/15/2017    Speciality Comments: [facility]  Dr Inocente will get a plaquenil  exam in January.  Patient states she also had an eye exam done at [facility] in [place], left a message to fax over last eye exam  Procedures:  No procedures performed Allergies: Aspirin and Ibuprofen   Assessment / Plan:     Visit Diagnoses: No diagnosis found.  ***  Orders: No orders of the defined types were placed in this encounter.  No orders of the defined types were placed in this encounter.    Follow-Up Instructions: No follow-ups on file.  Yuko Coventry M Porcia Morganti, CMA  Note - This record has been created using Animal nutritionist.  Chart creation errors have been sought, but may not always  have been located. Such creation errors do not reflect on  the standard of medical care.

## 2024-10-20 ENCOUNTER — Ambulatory Visit: Payer: Self-pay

## 2024-10-29 ENCOUNTER — Other Ambulatory Visit: Payer: Self-pay

## 2024-10-29 DIAGNOSIS — G894 Chronic pain syndrome: Secondary | ICD-10-CM

## 2024-11-01 ENCOUNTER — Ambulatory Visit: Admitting: Internal Medicine

## 2024-11-01 DIAGNOSIS — M1712 Unilateral primary osteoarthritis, left knee: Secondary | ICD-10-CM

## 2024-11-01 DIAGNOSIS — M0600 Rheumatoid arthritis without rheumatoid factor, unspecified site: Secondary | ICD-10-CM

## 2024-11-01 DIAGNOSIS — M25552 Pain in left hip: Secondary | ICD-10-CM

## 2024-11-01 DIAGNOSIS — Z79899 Other long term (current) drug therapy: Secondary | ICD-10-CM

## 2024-11-02 NOTE — Progress Notes (Signed)
 Office Visit Note  Patient: [name]             Date of Birth: [birth year]           MRN: [id number]             PCP: Glennon Sand, NP (Inactive) Referring: No ref. provider found Visit Date: 11/03/2024   Subjective:  Pain (Left arm/hand flared up. )   Discussed the use of AI scribe software for clinical note transcription with the patient, who gave verbal consent to proceed.  History of Present Illness   [name] is a 68 y.o. female here for follow up with rheumatoid arthritis and osteoarthritis on HCQ 400 mg daily. She presents with left wrist swelling and pain.  She has experienced swelling and pain in her left wrist for several weeks, with the swelling increasing during the day and decreasing at night. The wrist has been described as 'pretty nasty' and 'clicking' for a few weeks. She has taken prednisone  a couple of times, which made the symptoms bearable quickly after she starts. The swelling is a new problem, and she speculates about the possibility of a cyst. The pain extends into her elbow. Her left 5th finger in particular swells significantly but does not go numb.  She experiences pain in her knee, which is swollen and causes discomfort, especially at night. Her knee has been replaced previously. The pain is persistent and worsens when she straightens it, causing it to 'catch' and feel like it's 'falling'.  She last took prednisone  approximately two weeks ago. She has not suffered any new infections since our last visit.   Previous HPI 06/10/2024 [name] is a 68 y.o. female here for follow up with rheumatoid arthritis and osteoarthritis on HCQ 400 mg daily. She presents with joint pain and stiffness.     She experiences persistent joint pain, particularly in her knees, ankles, toes, elbows, and back. The pain is described as 'never ending' and significantly affects her ability to sleep, requiring her to be 'dead tired' to fall asleep. Restless leg  syndrome exacerbates her discomfort, especially when her ankles are affected, and her right toes were particularly painful last night. The pain sometimes radiates to her arm and elbow.   Her current medication regimen includes Flexeril  as needed for pain relief, but she is cautious about its use due to its lasting effects the next day, which interfere with her ability to operate machinery. Although Cymbalta  helps, she has not been taking it due to concerns about its side effects. She uses diclofenac  regularly, which she finds effective, but notes it also has lasting effects into the next day. She has been taking hydroxychloroquine , which was prescribed at 1 tablet daily but has been taking it 2 tablets daily.   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 She notes that her knee swells and is 'heating up with arthritis,' and she uses a cushion to avoid putting pressure on it. She reports previous knee injection in April provided significant relief, making her feel better overall.   She denies any recent illnesses but feels 'dragging' and tired. She has not been sleeping well for the past two years, although she used to sleep fine before.     Previous HPI 03/11/2024 [name] is a 68 year old female with rheumatoid arthritis and osteoarthritis who presents with joint pain and stiffness.  She experiences joint pain and stiffness, primarily affecting her hands, knees, and hips, with a history of rheumatoid arthritis. The symptoms are persistent, with a deep ache and stiffness, especially in the morning. Her elbows are also affected, although elbow pain is less commonly reported. Recent blood tests show a low positive anti-CCP antibody level and a slightly elevated CRP at 10.4. X-rays of her hands reveal no erosive joint damage but indicate wear and tear arthritis, particularly at the tips of her fingers. Her bone density in  the hands appears low, although she has not had a DEXA scan.   She reports significant pain and functional limitation in her left knee, diagnosed with osteoarthritis. X-rays reveal bone spurs and narrowing of the joint space, with severe arthritis in the front compartment. She experiences difficulty with knee movement, especially when getting up and down, and describes the pain as 'giving her a fit.' Despite the pain, she remains active, walking frequently and performing daily tasks.   She is concerned about her hip pain, initially thought to be related to her back, as the pain radiates and sometimes prevents her from lying on one side. She remains active in her daily life, often performing physically demanding tasks.   Her family history includes a mother, aged 49, with a history of strong bones, having never broken a bone despite numerous falls.      Previous HPI 02/09/24 [name] is a 68 year old female with history of rheumatoid arthritis who presents with worsening joint pain and difficulty managing symptoms.   She has a history of rheumatoid arthritis and has been taking hydroxychloroquine  (Plaquenil ) for years. Recently, she stopped taking it due to a change in doctors and feels worse without it. Her body feels 'sore', and she has difficulty getting started in the morning, with symptoms worsening over time. She experiences significant pain in her shoulders, hips, back, and neck, affecting her sleep, causing her to toss and turn due to discomfort. She also reports pain in her wrists and knees, with a history of a knee fracture two years ago that healed on its own. Her hip pain has been severe for about six months, and she has not had it x-rayed yet.   She has a history of a torn rotator cuff and neck issues, with x-rays done on her neck. She feels a 'crunchy' sensation in her neck and back. She has been diagnosed with a partial rotator cuff tear and has received shoulder injections, which  she found helpful. 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 She takes Lyrica , which she finds essential for managing her shoulder pain, but has difficulty getting it prescribed consistently.   She has been told she has fibromyalgia in the past and finds Lyrica  helpful for this condition as well. She experiences muscle weakness and was found to have low vitamin D  levels, which improved with supplementation. She takes Voltaren , which helps with pain but causes sleepiness and stomach issues, requiring her to take nausea medication first. She takes it only when desperate, as it provides significant relief despite the side effects.   She has diabetes, which she finds difficult to control. Her diabetes doctor suggested that lack of exercise or pain might be contributing factors. Despite this, she is physically active, working daily on a farm. She experiences twitching and jerking at night, and her hands and feet sometimes feel 'funny' or don't 'get the message' to function properly. No significant  swelling except for a past episode during the COVID shutdown, which was painful.   She has not had a bone density scan but believes her bones are strong due to lifelong milk consumption. She has not been diagnosed with osteoporosis. She reports that her leg was injured by a calf, resulting in a fracture that was painful but healed without significant intervention.    Review of Systems  Constitutional:  Positive for fatigue.  HENT:  Positive for mouth dryness. Negative for mouth sores.   Eyes:  Positive for dryness.  Respiratory:  Negative for shortness of breath.   Cardiovascular:  Negative for chest pain and palpitations.  Gastrointestinal:  Negative for blood in stool, constipation and diarrhea.  Endocrine: Negative for increased urination.  Genitourinary:  Positive for involuntary urination.  Musculoskeletal:  Positive for joint  pain, joint pain, joint swelling, myalgias, muscle weakness, morning stiffness, muscle tenderness and myalgias. Negative for gait problem.  Skin:  Positive for rash and sensitivity to sunlight. Negative for color change and hair loss.  Allergic/Immunologic: Positive for susceptible to infections.  Neurological:  Negative for dizziness and headaches.  Hematological:  Negative for swollen glands.  Psychiatric/Behavioral:  Positive for sleep disturbance. Negative for depressed mood. The patient is not nervous/anxious.     PMFS History:  Patient Active Problem List   Diagnosis Date Noted   High risk medication use 11/03/2024   Persistent cough 02/11/2024   Insomnia 11/10/2023   Vitamin D  insufficiency 11/10/2023   Need for influenza vaccination 11/10/2023   Type 2 diabetes mellitus with hyperglycemia (HCC) 08/05/2023   Seronegative rheumatoid arthritis (HCC) 08/05/2023   Allergic rhinitis 08/05/2023   MDD (major depressive disorder) 08/05/2023   Chronic pain syndrome 08/05/2023   Hyperlipidemia associated with type 2 diabetes mellitus (HCC) 08/05/2023   Breast cancer screening by mammogram 08/05/2023   OA (osteoarthritis) of knee 01/13/2017    Past Medical History:  Diagnosis Date   Arthritis    Diabetes mellitus without complication (HCC)    PONV (postoperative nausea and vomiting)    Pulmonary embolism (HCC) 2005    Family History  Problem Relation Age of Onset   Cancer Mother    Liver disease Brother    Diabetes Brother    Diabetes Brother    Past Surgical History:  Procedure Laterality Date   ABDOMINAL HYSTERECTOMY     BREAST BIOPSY Left    CARPAL TUNNEL RELEASE Left 12/09/1998   CARPAL TUNNEL RELEASE Right 12/09/1998   CATARACT EXTRACTION W/PHACO Right 11/21/2014   Procedure: CATARACT EXTRACTION PHACO AND INTRAOCULAR LENS PLACEMENT (IOC);  Surgeon: Cherene Mania, MD;  Location: [facility];  Service: Ophthalmology;  Laterality: Right;  CDE:5.78   TOTAL KNEE ARTHROPLASTY Right  01/13/2017   Procedure: RIGHT TOTAL KNEE ARTHROPLASTY;  Surgeon: Dempsey Moan, MD;  Location: [facility];  Service: Orthopedics;  Laterality: Right;  Adductor Block   Social History   Social History Narrative   Not on file   Immunization History  Administered Date(s) Administered   Fluad Trivalent(High Dose 65+) 11/10/2023   Rabies, IM 01/11/2023   Tdap 06/28/2015, 01/11/2023     Objective: Vital Signs: BP (!) 95/51   Pulse 76   Temp (!) 97.5 F (36.4 C)   Resp 16   Ht 5' 8.5 (1.74 m)   Wt 162 lb 12.8 oz (73.8 kg)   BMI 24.39 kg/m    Physical Exam Eyes:     Conjunctiva/sclera: Conjunctivae normal.  Cardiovascular:     Rate and Rhythm: Normal  rate and regular rhythm.  Pulmonary:     Effort: Pulmonary effort is normal.     Breath sounds: Normal breath sounds.  Lymphadenopathy:     Cervical: No cervical adenopathy.  Skin:    General: Skin is warm and dry.  Neurological:     Mental Status: She is alert.  Psychiatric:        Mood and Affect: Mood normal.      Musculoskeletal Exam:  Shoulders limited overhead abduction in active ROM, tenderness to pressure, no palpable effusion Elbows full ROM no tenderness or swelling Wrists full ROM, left wrist swelling present on flexor side over ulnar head, no focal tenderness to pressure Fingers heberdon's nodes b/l hands, tenderness to pressure without palpable synovitis, grip strength normal Left knee anterior and joint line tenderness to pressure, patellofemoral crepitus, palpable effusion in suprapatellar pouch  Investigation: No additional findings.  Imaging: MM 3D SCREENING MAMMOGRAM BILATERAL BREAST Result Date: 10/15/2024 CLINICAL DATA:  Screening. EXAM: DIGITAL SCREENING BILATERAL MAMMOGRAM WITH TOMOSYNTHESIS AND CAD TECHNIQUE: Bilateral screening digital craniocaudal and mediolateral oblique mammograms were obtained. Bilateral screening digital breast tomosynthesis was performed. The images were evaluated with  computer-aided detection. COMPARISON:  Previous exam(s). ACR Breast Density Category b: There are scattered areas of fibroglandular density. FINDINGS: There are no findings suspicious for malignancy. IMPRESSION: No mammographic evidence of malignancy. A result letter of this screening mammogram will be mailed directly to the patient. RECOMMENDATION: Screening mammogram in one year. (Code:SM-B-01Y) BI-RADS CATEGORY  1: Negative. 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  Patient states she also had an eye exam done at [facility] in [place], left a message to fax over last eye exam  Procedures:  No procedures performed Allergies: Aspirin and Ibuprofen   Assessment / Plan:     Visit Diagnoses: Seronegative rheumatoid arthritis (HCC) - Plan: Sedimentation rate, C-reactive protein, Synovial Fluid Analysis, Complete Chronic RA with significant left knee effusion and pain. Current medication insufficient. Differential includes inflammatory swelling vs. degenerative changes. Fluid analysis needed. - Checking sed rate for disease activity monitoring - Continue HCQ 300 mg daily, if ESR/CRP worsening consider adding low dose steroid or change DMARD maybe Arava if needed  High risk medication use - Plan: CBC with Differential/Platelet, Comprehensive metabolic panel with GFR No serious interval infections. -  Checking CBC and CMP for medication monitoring on methotrexate  Primary osteoarthritis of left knee - Plan: Synovial Fluid Analysis, Complete Effusion, left knee - Plan: Synovial Fluid Analysis, Complete - Performed knee aspiration for fluid analysis. - Administered steroid injection to reduce inflammation. - Consider surgical intervention if degenerative changes are primary cause.  Left wrist tenosynovitis Swelling and pain, particularly affecting the thumb. Symptoms improved with prednisone . Previous surgery may contribute. No definite tenosynovitis on limited  MSUS exam so could be more use related over previously damaged area.       Orders: Orders Placed This Encounter  Procedures   Sedimentation rate   C-reactive protein   CBC with Differential/Platelet   Comprehensive metabolic panel with GFR   Synovial Fluid Analysis, Complete   No orders of the defined types were placed in this encounter.    Follow-Up Instructions: Return in about 3 months (around 02/03/2025) for RA/OA HCQ/inj.   Lonni LELON Ester, MD  Note - This record has been created using [employer].  Chart creation errors have been sought, but may not always  have been located. Such creation errors do not reflect on  the standard of medical care.

## 2024-11-03 ENCOUNTER — Encounter: Payer: Self-pay | Admitting: Internal Medicine

## 2024-11-03 ENCOUNTER — Ambulatory Visit: Attending: Internal Medicine | Admitting: Internal Medicine

## 2024-11-03 VITALS — BP 95/51 | HR 76 | Temp 97.5°F | Resp 16 | Ht 68.5 in | Wt 162.8 lb

## 2024-11-03 DIAGNOSIS — M1712 Unilateral primary osteoarthritis, left knee: Secondary | ICD-10-CM | POA: Diagnosis present

## 2024-11-03 DIAGNOSIS — M0600 Rheumatoid arthritis without rheumatoid factor, unspecified site: Secondary | ICD-10-CM | POA: Insufficient documentation

## 2024-11-03 DIAGNOSIS — Z79899 Other long term (current) drug therapy: Secondary | ICD-10-CM | POA: Insufficient documentation

## 2024-11-03 DIAGNOSIS — M25462 Effusion, left knee: Secondary | ICD-10-CM | POA: Diagnosis present

## 2024-11-03 LAB — SYNOVIAL FLUID ANALYSIS, COMPLETE
Basophils, %: 0 %
Eosinophils-Synovial: 0 % (ref 0–2)
Lymphocytes-Synovial Fld: 73 % (ref 0–74)
Monocyte/Macrophage: 22 % (ref 0–69)
Neutrophil, Synovial: 5 % (ref 0–24)
Synoviocytes, %: 0 % (ref 0–15)
WBC, Synovial: 286 {cells}/uL — ABNORMAL HIGH (ref <150)

## 2024-11-05 LAB — COMPREHENSIVE METABOLIC PANEL WITH GFR
AG Ratio: 2.4 (calc) (ref 1.0–2.5)
ALT: 12 U/L (ref 6–29)
AST: 16 U/L (ref 10–35)
Albumin: 4.4 g/dL (ref 3.6–5.1)
Alkaline phosphatase (APISO): 84 U/L (ref 37–153)
BUN: 12 mg/dL (ref 7–25)
CO2: 28 mmol/L (ref 20–32)
Calcium: 9.2 mg/dL (ref 8.6–10.4)
Chloride: 105 mmol/L (ref 98–110)
Creat: 0.8 mg/dL (ref 0.50–1.05)
Globulin: 1.8 g/dL — ABNORMAL LOW (ref 1.9–3.7)
Glucose, Bld: 135 mg/dL — ABNORMAL HIGH (ref 65–99)
Potassium: 4.1 mmol/L (ref 3.5–5.3)
Sodium: 141 mmol/L (ref 135–146)
Total Bilirubin: 0.4 mg/dL (ref 0.2–1.2)
Total Protein: 6.2 g/dL (ref 6.1–8.1)
eGFR: 81 mL/min/1.73m2 (ref >=60)

## 2024-11-05 LAB — CBC WITH DIFFERENTIAL/PLATELET
Absolute Lymphocytes: 2110 {cells}/uL (ref 850–3900)
Absolute Monocytes: 516 {cells}/uL (ref 200–950)
Basophils Absolute: 62 {cells}/uL (ref 0–200)
Basophils Relative: 0.8 %
Eosinophils Absolute: 108 {cells}/uL (ref 15–500)
Eosinophils Relative: 1.4 %
HCT: 37.7 % (ref 35.9–46.0)
Hemoglobin: 12.5 g/dL (ref 11.7–15.5)
MCH: 29.8 pg (ref 27.0–33.0)
MCHC: 33.2 g/dL (ref 31.6–35.4)
MCV: 89.8 fL (ref 81.4–101.7)
MPV: 11.8 fL (ref 7.5–12.5)
Monocytes Relative: 6.7 %
Neutro Abs: 4905 {cells}/uL (ref 1500–7800)
Neutrophils Relative %: 63.7 %
Platelets: 213 Thousand/uL (ref 140–400)
RBC: 4.2 Million/uL (ref 3.80–5.10)
RDW: 11.9 % (ref 11.0–15.0)
Total Lymphocyte: 27.4 %
WBC: 7.7 Thousand/uL (ref 3.8–10.8)

## 2024-11-05 LAB — C-REACTIVE PROTEIN: CRP: <3 mg/L (ref <8.0)

## 2024-11-05 LAB — SEDIMENTATION RATE: Sed Rate: 6 mm/h (ref 0–30)

## 2024-11-08 ENCOUNTER — Ambulatory Visit: Payer: Self-pay | Admitting: Internal Medicine

## 2024-11-08 NOTE — Progress Notes (Signed)
 Her sed rate and CRP were normal. The knee fluid showed only 286 white blood cells which is very low, consistent with osteoarthritis as the main problem work. Based on this I do not think we need to increased her RA treatment. If knee pain does not stay improved for long I'd recommend she probably needs to see orthopedics clinic for more definitive management.

## 2024-11-23 ENCOUNTER — Other Ambulatory Visit: Payer: Self-pay

## 2024-11-23 DIAGNOSIS — E1169 Type 2 diabetes mellitus with other specified complication: Secondary | ICD-10-CM

## 2024-11-23 MED ORDER — ATORVASTATIN CALCIUM 10 MG PO TABS: 10.0000 mg | ORAL_TABLET | Freq: Every day | ORAL | 3 refills | Status: AC

## 2024-11-26 ENCOUNTER — Other Ambulatory Visit: Payer: Self-pay

## 2024-11-26 DIAGNOSIS — G894 Chronic pain syndrome: Secondary | ICD-10-CM

## 2024-11-30 ENCOUNTER — Other Ambulatory Visit: Payer: Self-pay | Admitting: Internal Medicine

## 2024-11-30 DIAGNOSIS — M0600 Rheumatoid arthritis without rheumatoid factor, unspecified site: Secondary | ICD-10-CM

## 2024-12-06 ENCOUNTER — Other Ambulatory Visit: Payer: Self-pay | Admitting: Internal Medicine

## 2024-12-06 DIAGNOSIS — M0600 Rheumatoid arthritis without rheumatoid factor, unspecified site: Secondary | ICD-10-CM

## 2024-12-06 NOTE — Telephone Encounter (Signed)
 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   Labs: 11/03/2024 Her sed rate and CRP were normal. The knee fluid showed only 286 white blood cells which is very low, consistent with osteoarthritis as the main problem work. Based on this I do not think we need to increased her RA treatment.   Next Visit: 02/04/2025  Last Visit: 11/03/2024  DX: Seronegative rheumatoid arthritis   Current Dose per office note 11/03/2024: HCQ 300 mg daily   Okay to refill Plaquenil ?

## 2024-12-20 ENCOUNTER — Other Ambulatory Visit: Payer: Self-pay | Admitting: Nurse Practitioner

## 2024-12-29 ENCOUNTER — Other Ambulatory Visit: Payer: Self-pay

## 2024-12-29 DIAGNOSIS — G894 Chronic pain syndrome: Secondary | ICD-10-CM

## 2025-01-24 ENCOUNTER — Ambulatory Visit: Admitting: Nurse Practitioner

## 2025-02-02 ENCOUNTER — Ambulatory Visit: Payer: Self-pay | Admitting: Nurse Practitioner

## 2025-02-04 ENCOUNTER — Ambulatory Visit: Admitting: Internal Medicine

## 2025-02-09 ENCOUNTER — Ambulatory Visit: Admitting: Nurse Practitioner
